# Patient Record
Sex: Male | Born: 1985 | Race: White | Hispanic: No | Marital: Single | Smoking: Current every day smoker
Health system: Southern US, Community
[De-identification: ages and names within clinical notes are randomized; demographics above are authoritative.]

## PROBLEM LIST (undated history)

## (undated) DIAGNOSIS — M25511 Pain in right shoulder: Secondary | ICD-10-CM

## (undated) DIAGNOSIS — S42009A Fracture of unspecified part of unspecified clavicle, initial encounter for closed fracture: Secondary | ICD-10-CM

## (undated) DIAGNOSIS — R933 Abnormal findings on diagnostic imaging of other parts of digestive tract: Secondary | ICD-10-CM

## (undated) DIAGNOSIS — S32059A Unspecified fracture of fifth lumbar vertebra, initial encounter for closed fracture: Secondary | ICD-10-CM

## (undated) DIAGNOSIS — T4145XA Adverse effect of unspecified anesthetic, initial encounter: Secondary | ICD-10-CM

## (undated) DIAGNOSIS — Q0700 Arnold-Chiari syndrome without spina bifida or hydrocephalus: Secondary | ICD-10-CM

## (undated) DIAGNOSIS — T8859XA Other complications of anesthesia, initial encounter: Secondary | ICD-10-CM

## (undated) HISTORY — DX: Unspecified fracture of fifth lumbar vertebra, initial encounter for closed fracture: S32.059A

## (undated) HISTORY — DX: Fracture of unspecified part of unspecified clavicle, initial encounter for closed fracture: S42.009A

## (undated) HISTORY — DX: Abnormal findings on diagnostic imaging of other parts of digestive tract: R93.3

---
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
## 2004-07-24 ENCOUNTER — Ambulatory Visit: Payer: Self-pay | Admitting: Internal Medicine

## 2005-03-23 ENCOUNTER — Ambulatory Visit: Payer: Self-pay | Admitting: Internal Medicine

## 2005-04-27 ENCOUNTER — Emergency Department (HOSPITAL_COMMUNITY): Admission: EM | Admit: 2005-04-27 | Discharge: 2005-04-27 | Payer: Self-pay | Admitting: Emergency Medicine

## 2005-09-25 ENCOUNTER — Ambulatory Visit: Payer: Self-pay | Admitting: Internal Medicine

## 2006-05-14 HISTORY — PX: OTHER SURGICAL HISTORY: SHX169

## 2006-09-24 ENCOUNTER — Inpatient Hospital Stay (HOSPITAL_COMMUNITY): Admission: RE | Admit: 2006-09-24 | Discharge: 2006-09-28 | Payer: Self-pay | Admitting: Neurosurgery

## 2007-03-20 ENCOUNTER — Encounter: Admission: RE | Admit: 2007-03-20 | Discharge: 2007-03-20 | Payer: Self-pay | Admitting: Neurosurgery

## 2007-07-27 ENCOUNTER — Emergency Department (HOSPITAL_COMMUNITY): Admission: EM | Admit: 2007-07-27 | Discharge: 2007-07-27 | Payer: Self-pay | Admitting: Emergency Medicine

## 2008-01-01 ENCOUNTER — Encounter: Admission: RE | Admit: 2008-01-01 | Discharge: 2008-01-01 | Payer: Self-pay | Admitting: Neurosurgery

## 2008-03-16 ENCOUNTER — Telehealth (INDEPENDENT_AMBULATORY_CARE_PROVIDER_SITE_OTHER): Payer: Self-pay | Admitting: *Deleted

## 2008-03-18 ENCOUNTER — Encounter: Payer: Self-pay | Admitting: Internal Medicine

## 2008-04-13 ENCOUNTER — Ambulatory Visit: Payer: Self-pay | Admitting: Internal Medicine

## 2008-04-13 DIAGNOSIS — Q054 Unspecified spina bifida with hydrocephalus: Secondary | ICD-10-CM | POA: Insufficient documentation

## 2008-04-13 DIAGNOSIS — IMO0002 Reserved for concepts with insufficient information to code with codable children: Secondary | ICD-10-CM | POA: Insufficient documentation

## 2008-04-13 DIAGNOSIS — F411 Generalized anxiety disorder: Secondary | ICD-10-CM | POA: Insufficient documentation

## 2008-04-13 LAB — CONVERTED CEMR LAB
Bilirubin Urine: NEGATIVE
Blood in Urine, dipstick: NEGATIVE
Glucose, Urine, Semiquant: NEGATIVE
WBC Urine, dipstick: NEGATIVE
pH: 6

## 2008-04-24 LAB — CONVERTED CEMR LAB
Basophils Relative: 3.8 % — ABNORMAL HIGH (ref 0.0–3.0)
CO2: 28 meq/L (ref 19–32)
Calcium: 9.5 mg/dL (ref 8.4–10.5)
Chloride: 105 meq/L (ref 96–112)
Creatinine, Ser: 0.9 mg/dL (ref 0.4–1.5)
Eosinophils Relative: 9.2 % — ABNORMAL HIGH (ref 0.0–5.0)
GFR calc Af Amer: 134 mL/min
GFR calc non Af Amer: 111 mL/min
HCT: 44.1 % (ref 39.0–52.0)
Lymphocytes Relative: 45.3 % (ref 12.0–46.0)
MCV: 86.4 fL (ref 78.0–100.0)
Monocytes Absolute: 0.7 10*3/uL (ref 0.1–1.0)
Monocytes Relative: 13.5 % — ABNORMAL HIGH (ref 3.0–12.0)
Neutro Abs: 1.4 10*3/uL (ref 1.4–7.7)
Potassium: 4.3 meq/L (ref 3.5–5.1)
Sodium: 142 meq/L (ref 135–145)
Total CHOL/HDL Ratio: 2.5
Total Protein: 6.9 g/dL (ref 6.0–8.3)
VLDL: 8 mg/dL (ref 0–40)

## 2008-04-27 ENCOUNTER — Telehealth (INDEPENDENT_AMBULATORY_CARE_PROVIDER_SITE_OTHER): Payer: Self-pay | Admitting: *Deleted

## 2008-04-27 ENCOUNTER — Encounter (INDEPENDENT_AMBULATORY_CARE_PROVIDER_SITE_OTHER): Payer: Self-pay | Admitting: *Deleted

## 2009-06-16 ENCOUNTER — Ambulatory Visit: Payer: Self-pay | Admitting: Internal Medicine

## 2009-06-16 DIAGNOSIS — K625 Hemorrhage of anus and rectum: Secondary | ICD-10-CM | POA: Insufficient documentation

## 2009-06-16 DIAGNOSIS — L989 Disorder of the skin and subcutaneous tissue, unspecified: Secondary | ICD-10-CM | POA: Insufficient documentation

## 2009-06-17 ENCOUNTER — Encounter (INDEPENDENT_AMBULATORY_CARE_PROVIDER_SITE_OTHER): Payer: Self-pay | Admitting: *Deleted

## 2009-06-20 LAB — CONVERTED CEMR LAB
Albumin: 4.3 g/dL (ref 3.5–5.2)
Basophils Absolute: 0.1 10*3/uL (ref 0.0–0.1)
Basophils Relative: 0.6 % (ref 0.0–3.0)
Bilirubin, Direct: 0 mg/dL (ref 0.0–0.3)
Creatinine, Ser: 0.9 mg/dL (ref 0.4–1.5)
Eosinophils Absolute: 0.4 10*3/uL (ref 0.0–0.7)
Eosinophils Relative: 4.5 % (ref 0.0–5.0)
HCT: 43 % (ref 39.0–52.0)
LDL Cholesterol: 57 mg/dL (ref 0–99)
Lymphocytes Relative: 25.6 % (ref 12.0–46.0)
MCHC: 33.8 g/dL (ref 30.0–36.0)
MCV: 88.4 fL (ref 78.0–100.0)
Neutro Abs: 5.1 10*3/uL (ref 1.4–7.7)
Platelets: 242 10*3/uL (ref 150.0–400.0)
RDW: 12 % (ref 11.5–14.6)
Total Bilirubin: 1.7 mg/dL — ABNORMAL HIGH (ref 0.3–1.2)
Total CHOL/HDL Ratio: 2
Total Protein: 7 g/dL (ref 6.0–8.3)
Triglycerides: 100 mg/dL (ref 0.0–149.0)
VLDL: 20 mg/dL (ref 0.0–40.0)
WBC: 8.4 10*3/uL (ref 4.5–10.5)

## 2009-06-21 ENCOUNTER — Ambulatory Visit: Payer: Self-pay | Admitting: Gastroenterology

## 2009-06-23 ENCOUNTER — Encounter: Payer: Self-pay | Admitting: Internal Medicine

## 2009-06-23 ENCOUNTER — Encounter (INDEPENDENT_AMBULATORY_CARE_PROVIDER_SITE_OTHER): Payer: Self-pay | Admitting: *Deleted

## 2009-07-07 ENCOUNTER — Ambulatory Visit: Payer: Self-pay | Admitting: Family Medicine

## 2009-07-11 ENCOUNTER — Telehealth (INDEPENDENT_AMBULATORY_CARE_PROVIDER_SITE_OTHER): Payer: Self-pay | Admitting: *Deleted

## 2009-07-11 ENCOUNTER — Encounter (INDEPENDENT_AMBULATORY_CARE_PROVIDER_SITE_OTHER): Payer: Self-pay | Admitting: *Deleted

## 2009-07-13 ENCOUNTER — Telehealth (INDEPENDENT_AMBULATORY_CARE_PROVIDER_SITE_OTHER): Payer: Self-pay | Admitting: *Deleted

## 2009-07-13 ENCOUNTER — Ambulatory Visit: Payer: Self-pay | Admitting: Family Medicine

## 2009-07-15 ENCOUNTER — Encounter (INDEPENDENT_AMBULATORY_CARE_PROVIDER_SITE_OTHER): Payer: Self-pay | Admitting: *Deleted

## 2009-07-15 ENCOUNTER — Ambulatory Visit: Payer: Self-pay | Admitting: Gastroenterology

## 2009-08-02 ENCOUNTER — Ambulatory Visit: Payer: Self-pay | Admitting: Gastroenterology

## 2009-08-05 ENCOUNTER — Encounter: Payer: Self-pay | Admitting: Gastroenterology

## 2010-02-22 ENCOUNTER — Ambulatory Visit: Payer: Self-pay | Admitting: Internal Medicine

## 2010-02-22 DIAGNOSIS — R42 Dizziness and giddiness: Secondary | ICD-10-CM

## 2010-06-13 NOTE — Letter (Signed)
Summary: Patient Notice- Polyp Results  [facility]  [address] [place], [place] [address]   Phone: [phone]  Fax: [phone]        August 05, 2009 MRN: [id number]    Jonathan Reese [address] [place], [place]  [address]    Dear [name],  I am pleased to inform you that the colon polyp(s) removed during your recent colonoscopy was (were) found to be benign (no cancer detected) upon pathologic examination.  I recommend you have a repeat colonoscopy examination in _ years to look for recurrent polyps, as having colon polyps increases your risk for having recurrent polyps or even colon cancer in the future.THE POLYP WAS A JUVENILE POLYP...FOLLOWUP ONLY AS NEEDED....  Should you develop new or worsening symptoms of abdominal pain, bowel habit changes or bleeding from the rectum or bowels, please schedule an evaluation with either your primary care physician or with me.  Additional information/recommendations:  _XX_ No further action with gastroenterology is needed at this time. Please      follow-up with your primary care physician for your other healthcare      needs.  __ Please call [phone] to schedule a return visit to review your      situation.  __ Please keep your follow-up visit as already scheduled.  __ Continue treatment plan as outlined the day of your exam.  Please call us if you are having persistent problems or have questions about your condition that have not been fully answered at this time.  Sincerely,  Mardella Layman MD [facility]  This letter has been electronically signed by your physician.  Appended Document: Patient Notice- Polyp Results letter mailed 3.28.11

## 2010-06-13 NOTE — Letter (Signed)
Summary: Generic Letter  [facility]  [address] [place], [place] [address]   Phone: [phone]  Fax: [phone]    07/11/2009  Jonathan Reese [address] [place], [place]  [address]  To Whom it May Concern:  Please allow the above patient to return back to work on July 11, 2009.          Sincerely,    Loreen Freud, DO

## 2010-06-13 NOTE — Letter (Signed)
Summary: New Patient letter  [facility]  [address] [place], [place] [address]   Phone: [phone]  Fax: [phone]       06/23/2009 MRN: [id number]  [facility] [address] [place], [place]  [address]  Dear Mr. STUDLEY,  Welcome to the Gastroenterology Division at [facility].    You are scheduled to see Dr. Jarold Motto on 07-15-09 at 10:00a.m. on the 3rd floor at [facility], [address].  We ask that you try to arrive at our office 15 minutes prior to your appointment time to allow for check-in.  We would like you to complete the enclosed self-administered evaluation form prior to your visit and bring it with you on the day of your appointment.  We will review it with you.  Also, please bring a complete list of all your medications or, if you prefer, bring the medication bottles and we will list them.  Please bring your insurance card so that we may make a copy of it.  If your insurance requires a referral to see a specialist, please bring your referral form from your primary care physician.  Co-payments are due at the time of your visit and may be paid by cash, check or credit card.     Your office visit will consist of a consult with your physician (includes a physical exam), any laboratory testing he/she may order, scheduling of any necessary diagnostic testing (e.g. x-ray, ultrasound, CT-scan), and scheduling of a procedure (e.g. Endoscopy, Colonoscopy) if required.  Please allow enough time on your schedule to allow for any/all of these possibilities.    If you cannot keep your appointment, please call [phone] to cancel or reschedule prior to your appointment date.  This allows [place] the opportunity to schedule an appointment for another patient in need of care.  If you do not cancel or reschedule by 5 p.m. the business day prior to your appointment date, you will be charged a $50.00 late cancellation/no-show fee.    Thank you for  choosing [facility] for your medical needs.  We appreciate the opportunity to care for you.  Please visit [place] at our website  to learn more about our practice.                     Sincerely,                                                             The Gastroenterology Division

## 2010-06-13 NOTE — Letter (Signed)
Summary: Out of Work  [facility]  [address] [place], [place] [address]   Phone: [phone]  Fax: [phone]    July 07, 2009   Employee:  EMERIL STILLE    To Whom It May Concern:   For Medical reasons, please excuse the above named employee from work for the following dates:  Start:   July 07, 2009  End:   July 08, 2009  If you need additional information, please feel free to contact our office.         Sincerely,    Loreen Freud DO

## 2010-06-13 NOTE — Assessment & Plan Note (Signed)
Summary: rectal bleeding--ch.   History of Present Illness Visit Type: Initial Consult Primary GI MD: Sheryn Bison MD FACP FAGA Primary Provider: Willow Ora MD Requesting Provider: Willow Ora MD Chief Complaint: rectal bleeding  History of Present Illness:   25 year old white male with one year of recurrent asymptomatic bright red blood per rectum without rectal pain. He does have occasional spasmodic left lower quadrant pain but denies abdominal gas, bloating, upper GI, hepatobiliary, or systemic complaints. He has not had previous barium studies or endoscopic exams. He is asymptomatic in terms of medical problems except for some nonspecific neurological problems. 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 I reviewed his chart today and recent screening for sexual transmitted diseases was negative and CBC and liver profile was normal except for slightly elevated bilirubin level. He has had multiple tattoos but denies any history of hepatitis or pancreatitis.He denies alcohol, or NSAID abuse but does smoke a pack of cigarettes a day.   GI Review of Systems    Reports abdominal pain and  nausea.     Location of  Abdominal pain: LLQ.    Denies acid reflux, belching, bloating, chest pain, dysphagia with liquids, dysphagia with solids, heartburn, loss of appetite, vomiting, vomiting blood, weight loss, and  weight gain.      Reports rectal bleeding.     Denies anal fissure, black tarry stools, change in bowel habit, constipation, diarrhea, diverticulosis, fecal incontinence, heme positive stool, hemorrhoids, irritable bowel syndrome, jaundice, light color stool, liver problems, and  rectal pain.    Current Medications (verified): 1)  Augmentin 875-125 Mg Tabs (Amoxicillin-Pot Clavulanate) .Marland Kitchen.. 1 By Mouth Two Times A Day  Allergies (verified): 1)  ! Jonetta Osgood  Past History:  Past Medical History: [name] Chiari Malformation Fractured back vertebrae/bulging  disk Fractured Right collarbone  Past Surgical History: Reviewed history from 04/13/2008 and no changes required. Neurosurgery for [employer] [facility] [employer] Chiari Malformation 5/08 , Dr Jeral Fruit  Family History: Reviewed history from 06/16/2009 and no changes required. Father: LIVING Mother: LIVING Siblings: 2 SISTERS, 1 BROTHER DM-MG UNCLE, MGM   HTN-- several family members  colon ca--no, uncle w/  anal ca prostate ca--no MI--no anxiety-- M, S   Social History: Reviewed history from 06/16/2009 and no changes required. Occupation: Engineer, petroleum Alcohol use--yes, socially Regular exercise--yes, regulalrly  diet--  follows a low salt diet, avoids junk food   Review of Systems  The patient denies allergy/sinus, anemia, anxiety-new, arthritis/joint pain, back pain, blood in urine, breast changes/lumps, change in vision, confusion, cough, coughing up blood, depression-new, fainting, fatigue, fever, headaches-new, hearing problems, heart murmur, heart rhythm changes, itching, menstrual pain, muscle pains/cramps, night sweats, nosebleeds, pregnancy symptoms, shortness of breath, skin rash, sleeping problems, sore throat, swelling of feet/legs, swollen lymph glands, thirst - excessive , urination - excessive , urination changes/pain, urine leakage, vision changes, and voice change.   General:  Denies fever, chills, sweats, anorexia, fatigue, weakness, malaise, weight loss, and sleep disorder. CV:  Denies chest pains, angina, palpitations, syncope, dyspnea on exertion, orthopnea, PND, peripheral edema, and claudication. Resp:  Denies dyspnea at rest, dyspnea with exercise, cough, sputum, wheezing, coughing up blood, and pleurisy. GI:  Denies difficulty swallowing, pain on swallowing, nausea, indigestion/heartburn, vomiting, vomiting blood, abdominal pain, jaundice, gas/bloating, diarrhea, constipation, change in bowel habits, bloody BM's, black BMs, and fecal incontinence. GU:  Denies urinary  burning, blood in urine, urinary frequency, urinary hesitancy, nocturnal urination, urinary incontinence, penile discharge, genital sores, decreased libido, and erectile dysfunction.  MS:  Denies joint pain / LOM, joint swelling, joint stiffness, joint deformity, low back pain, muscle weakness, muscle cramps, muscle atrophy, leg pain at night, leg pain with exertion, and shoulder pain / LOM hand / wrist pain (CTS). Derm:  Denies rash, itching, dry skin, hives, moles, warts, and unhealing ulcers. Neuro:  Complains of abnormal sensation and headache; denies weakness, paralysis, seizures, syncope, tremors, vertigo, transient blindness, frequent falls, frequent headaches, difficulty walking, sciatica, radiculopathy other:, restless legs, memory loss, and confusion. Psych:  Denies depression, anxiety, memory loss, suicidal ideation, hallucinations, paranoia, phobia, and confusion. Endo:  Denies cold intolerance, heat intolerance, polydipsia, polyphagia, polyuria, unusual weight change, and hirsutism. Heme:  Denies bruising, bleeding, enlarged lymph nodes, and pagophagia. Allergy:  Denies hives, rash, sneezing, hay fever, and recurrent infections.  Vital Signs:  Patient profile:   25 year old male Height:      66.25 inches Weight:      162 pounds BMI:     26.04 Pulse rate:   68 / minute Pulse rhythm:   regular BP sitting:   90 / 42  (left arm)  Vitals Entered By: Chales Abrahams CMA Duncan Dull) (July 15, 2009 9:54 AM)  Physical Exam  General:  Well developed, well nourished, no acute distress.healthy appearing.  Multiple tattoos noted with extensive tattooing on the back area. Head:  Normocephalic and atraumatic. Eyes:  PERRLA, no icterus.exam deferred to patient's ophthalmologist.   Neck:  Supple; no masses or thyromegaly. Lungs:  Clear throughout to auscultation. Heart:  Regular rate and rhythm; no murmurs, rubs,  or bruits. Abdomen:  Soft, nontender and nondistended. No masses, hepatosplenomegaly or  hernias noted. Normal bowel sounds.Tenderness is noted over a mobile sigmoid colon. Rectal:  Normal exam.inspection is unremarkable without hemorrhoids,fissures, or fistulae. No rectal masses or tenderness with normal color stool which is +1 guaiac positive. Prostate:  .normal size prostate.   Msk:  Symmetrical with no gross deformities. Normal posture. Pulses:  Normal pulses noted. Extremities:  No clubbing, cyanosis, edema or deformities noted. Neurologic:  Alert and  oriented x4;  grossly normal neurologically. Skin:  Intact without significant lesions or rashes. Cervical Nodes:  No significant cervical adenopathy. Axillary Nodes:  No significant axillary adenopathy. Inguinal Nodes:  No significant inguinal adenopathy. Psych:  Alert and cooperative. Normal mood and affect.   Impression & Recommendations:  Problem # 1:  RECTAL BLEEDING (ICD-569.3) Assessment Unchanged Rule Out adenomatous polyp versus juvenile polyposis. There is nothing in his history suggest underlying inflammatory bowel disease. He is not abusing NSAIDs or salicylates. He does have an uncle who has had rectal-anal carcinoma. 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 I scheduled him for colonoscopy exam and we'll proceed accordingly.  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 He was to check with Dr.Paz before proceeding further along these lines.  Problem # 3:  ANXIETY DISORDER (ICD-300.00) Assessment: Unchanged I cannot see where he is on anti-anxiety medication at this time. To me, he denies psychiatric difficulties.  Patient Instructions: 1)  Copy sent to : [facility].MD 2)  Please continue current medications.  3)  Colonoscopy and Flexible Sigmoidoscopy brochure given.  4)  Conscious Sedation brochure given.   Appended Document: rectal bleeding--ch.    Clinical Lists  Changes  Medications: Added new medication of MOVIPREP 100 GM  SOLR (PEG-KCL-NACL-NASULF-NA ASC-C) As per prep instructions. - Signed Rx of MOVIPREP 100 GM  SOLR (PEG-KCL-NACL-NASULF-NA ASC-C) As per prep instructions.;  #1 x 0;  Signed;  Entered by: Ashok Cordia RN;  Authorized by: Mardella Layman MD [facility];  Method used: Electronically to [facility] [address] [place], [place], [place]  [address], Ph: [phone], Fax: [phone] Orders: Added new Test order of Colonoscopy (Colon) - Signed    Prescriptions: MOVIPREP 100 GM  SOLR (PEG-KCL-NACL-NASULF-NA ASC-C) As per prep instructions.  #1 x 0   Entered by:   Ashok Cordia RN   Authorized by:   Mardella Layman MD [facility]   Signed by:   Ashok Cordia RN on 07/15/2009   Method used:   Electronically to        [facility] (retail)       [address]       [place], [place]  [address]       Ph: [phone]       Fax: [phone]   RxID:   [id number]

## 2010-06-13 NOTE — Assessment & Plan Note (Signed)
Summary: FOR DIZZINESS//PH   Vital Signs:  Patient profile:   25 year old male Weight:      158.25 pounds Pulse rate:   90 / minute Pulse rhythm:   regular BP sitting:   126 / 70  (left arm) Cuff size:   re   Vitals Entered By: Army Fossa CMA (February 22, 2010 2:50 PM) CC: Pt here c/o dizziness, HA's, nausea, tingling sensation in Head, neck, and face.  Comments going on for 3 months, gotten worse in the past month. Pharm- [facility]   History of Present Illness: here complaining of dizziness and headaches Symptoms started in 2008 after he had surgery for  [facility] Chiari Malformation ,  Dr Jeral Fruit the symptoms are episodic but he has noted that in the last 2 months they have increased in frequency and intensity He describes pain that goes from the right side of the neck to the right side of the head ("tingling, numbness"), sometimes nausea. The dizziness is sensation of "keep moving" usually worse when he gets his head down and then up or when he stands up from being seated . He spoke w/  Dr. Jeral Fruit office yesterday, they agreed that he will see me today  and take it from there. Dr. Jeral Fruit suggested that MRI of the brain with and without.  ROS Denies fevers No sinus infection type of symptoms No seizure disorder   Current Medications (verified): 1)  None  Allergies (verified): 1)  ! Jonetta Osgood  Past History:  Past Medical History: [name] Chiari Malformation Fractured back vertebrae/bulging disk Fractured Right collarbone 3-11 Cscope-----Pedunculated polyp in the sigmoid colon, Bx juvenile polyp  Past Surgical History: Neurosurgery for [facility] Chiari Malformation 5/08 , Dr Jeral Fruit  Social History: Reviewed history from 06/16/2009 and no changes required. Occupation: Engineer, petroleum Alcohol use--yes, socially Regular exercise--yes, regulalrly  diet--  follows a low salt diet, avoids junk food   Physical Exam  General:  alert and well-developed.     Eyes:  pupils equal and reactive Ears:  R ear normal and L ear normal.   Lungs:  normal respiratory effort, no intercostal retractions, and no accessory muscle use.   Heart:  normal rate, regular rhythm, and no murmur.   Neurologic:  alert & oriented X3, cranial nerves II-XII intact, strength normal in all extremities, and gait normal.   DTRs normal and symmetric except for a slightly stronger DTR in the left knee. Neck is full range of motion Psych:  not anxious appearing and not depressed appearing.     Impression & Recommendations:  Problem # 1:  DIZZINESS (ICD-780.4) ongoing dizziness since surgery for  [name] Chiari Malformation 5/08 plan: Will discuss with Dr. Cassandria Santee office to confirm  that he needs MRI of the brain with and without Will schedule an office visit with Dr. Jeral Fruit  ADDENDUM: per Dr Eino Farber assistant-- they already  ordered a MRI and will set up an appointment for him

## 2010-06-13 NOTE — Letter (Signed)
Summary: New Patient letter  [facility]  [address] [place], [place] [address]   Phone: [phone]  Fax: [phone]       06/17/2009 MRN: [id number]  [facility] [address] [place], [place]  [address]  Dear Jonathan Reese,  Welcome to the Gastroenterology Division at [facility].    You are scheduled to see Dr.  Jarold Motto on 06/21/2009 at 10:00AM on the 3rd floor at [facility], [address].  We ask that you try to arrive at our office 15 minutes prior to your appointment time to allow for check-in.  We would like you to complete the enclosed self-administered evaluation form prior to your visit and bring it with you on the day of your appointment.  We will review it with you.  Also, please bring a complete list of all your medications or, if you prefer, bring the medication bottles and we will list them.  Please bring your insurance card so that we may make a copy of it.  If your insurance requires a referral to see a specialist, please bring your referral form from your primary care physician.  Co-payments are due at the time of your visit and may be paid by cash, check or credit card.     Your office visit will consist of a consult with your physician (includes a physical exam), any laboratory testing he/she may order, scheduling of any necessary diagnostic testing (e.g. x-ray, ultrasound, CT-scan), and scheduling of a procedure (e.g. Endoscopy, Colonoscopy) if required.  Please allow enough time on your schedule to allow for any/all of these possibilities.    If you cannot keep your appointment, please call [phone] to cancel or reschedule prior to your appointment date.  This allows [place] the opportunity to schedule an appointment for another patient in need of care.  If you do not cancel or reschedule by 5 p.m. the business day prior to your appointment date, you will be charged a $50.00 late cancellation/no-show fee.    Thank you for  choosing [facility] for your medical needs.  We appreciate the opportunity to care for you.  Please visit [place] at our website  to learn more about our practice.                     Sincerely,                                                             The Gastroenterology Division

## 2010-06-13 NOTE — Procedures (Signed)
Summary: Colonoscopy  Patient: Jonathan Reese Note: All result statuses are Final unless otherwise noted.  Tests: (1) Colonoscopy (COL)   COL Colonoscopy           DONE     [facility]     [address]     [place], [place]  [address]           COLONOSCOPY PROCEDURE REPORT           PATIENT:  Jonathan Reese, Jonathan Reese  MR#:  [id number]     BIRTHDATE:  Dec 29, 1985, 23 yrs. old  GENDER:  male           ENDOSCOPIST:  Vania Rea. Jarold Motto, MD, [facility]     Referred by:           PROCEDURE DATE:  08/02/2009     PROCEDURE:  Colonoscopy with snare polypectomy     ASA CLASS:  Class I     INDICATIONS:  FOBT positive stool, rectal bleeding           MEDICATIONS:   Fentanyl 75 mcg IV, Versed 7 mg IV           DESCRIPTION OF PROCEDURE:   After the risks benefits and     alternatives of the procedure were thoroughly explained, informed     consent was obtained.  Digital rectal exam was performed and     revealed no abnormalities.   The LB CF-[id number] endoscope     was introduced through the anus and advanced to the cecum, which     was identified by both the appendix and ileocecal valve, without     limitations.  The quality of the prep was good, using MoviPrep.     The instrument was then slowly withdrawn as the colon was fully     examined.     <<PROCEDUREIMAGES>>           FINDINGS:  A pedunculated polyp was found in the sigmoid colon.     LARGE 3CM. VASCULAR ON A THICK STALK HOT SNARE EXCISED AND SENT TO     PATH FOR EXAM.  This was otherwise a normal examination of the     colon.   Retroflexed views in the rectum revealed no     abnormalities.    The scope was then withdrawn from the patient     and the procedure completed.           COMPLICATIONS:  None           ENDOSCOPIC IMPRESSION:     1) Pedunculated polyp in the sigmoid colon     2) Otherwise normal examination     ADENOMA VS JUVENILE POLYP.     RECOMMENDATIONS:     1) Await biopsy results           REPEAT EXAM:  No          ______________________________     Vania Rea. Jarold Motto, MD, [facility] Graham           CC:  Willow Ora, MD           n.     Rosalie Doctor:   Vania Rea. Izaia Say at 08/02/2009 03:29 PM           Clance Boll, [id number]  Note: An exclamation mark (!) indicates a result that was not dispersed into the flowsheet. Document Creation Date: 08/02/2009 3:29 PM _______________________________________________________________________  (1) Order result status: Final Collection or observation date-time: 08/02/2009 15:23 Requested date-time:  Receipt date-time:  Reported date-time:  Referring Physician:   Ordering Physician: Sheryn Bison [id number]) Specimen Source:  Source: Launa Grill Order Number: [id number] Lab site:

## 2010-06-13 NOTE — Miscellaneous (Signed)
Summary: Waiver of Liability/[employer]  Waiver of Liability/[facility] [place]   Imported By: Lanelle Bal 06/21/2009 13:57:51  _____________________________________________________________________  External Attachment:    Type:   Image     Comment:   External Document

## 2010-06-13 NOTE — Assessment & Plan Note (Signed)
Summary: CPX AND FASTING LABS//SP[facility]   Vital Signs:  Patient profile:   25 year old male Height:      66.25 inches Weight:      158.2 pounds BMI:     25.43 Pulse rate:   68 / minute BP sitting:   112 / 80  Vitals Entered By: Dena Billet CC: cpx   History of Present Illness: CPX and multiple other issues :  blood in stools x 8 months , mixed w/ stools or floating in the water  denies nausea, vomiting, weight loss.  No pain no pain. no history of hemorrhoids  R shoulder and back pain x 3-4 years -- saw ortho for his back, the MRI showed Arnold Chiari Malformation.   "balance is off" symptoms started few months after neuro surgery  [name] Chiari Malformation)  2009  penile lesions, moles, noted about a year ago, getting larger  Allergies: No Known Drug Allergies  Past History:  Past Medical History: Arnold Chiari Malformation  Past Surgical History: Reviewed history from 04/13/2008 and no changes required. Neurosurgery for [facility] Chiari Malformation 5/08 , Dr Jeral Fruit  Family History: Reviewed history from 04/13/2008 and no changes required. Father: LIVING Mother: LIVING Siblings: 2 SISTERS, 1 BROTHER DM-MG UNCLE, MGM   HTN-- several family members  colon ca--no, uncle w/  anal ca prostate ca--no MI--no anxiety-- M, S   Social History: Occupation: Games developer Single Alcohol use--yes, socially Regular exercise--yes, regulalrly  diet--  follows a low salt diet, avoids junk food  Does Patient Exercise:  yes Smoking Status:  current Packs/Day:  1/2  Review of Systems General:  Denies fatigue, fever, and weight loss. CV:  Denies chest pain or discomfort and swelling of feet. Resp:  Denies cough and shortness of breath. Psych:  Denies depression; occasionally anxious .  Physical Exam  General:  alert, well-developed, and well-nourished.   Lungs:  normal respiratory effort, no intercostal retractions, no accessory muscle use, and normal breath  sounds.   Heart:  normal rate, regular rhythm, no murmur, and no gallop.   Abdomen:  soft, non-tender, no distention, no masses, no guarding, and no rigidity.   Rectal:  No external abnormalities noted. Normal sphincter tone. No rectal masses or tenderness. anoscopy: Few small internal hemorrhoids Genitalia:  circumcised, no hydrocele, no varicocele, no scrotal masses, no testicular masses or atrophy, and no urethral discharge.  at the base of the penis, he has several slightly papular dark lesions Prostate:  Prostate gland firm and smooth, no enlargement, nodularity, tenderness, mass, asymmetry or induration. Extremities:  no edema   Impression & Recommendations:  Problem # 1:  ROUTINE GENERAL MEDICAL EXAM@HEALTH  CARE FACL (ICD-V70.0)  Td 2005 per patient  flu shot--declined continue andhealthy lifestyle labs counseled: printed material provided regards STE, safe sex, safe driving  f/u 3 months to check on his other  issues   Problem # 2:  other problems : rectal bleeding : These could be from internal hemorrhoids however the patient states that sometimes the blood  is "mixed with stools". I recommend a GI evaluation, further workup? Chronic dizziness-- re asses on RTC shoulder and low back pain-- re asses on RTC penile lesions: Dermatology referral  Other Orders: Venipuncture (62130) TLB-Hepatic/Liver Function Pnl (80076-HEPATIC) TLB-BMP (Basic Metabolic Panel-BMET) (80048-METABOL) TLB-CBC Platelet - w/Differential (85025-CBCD) TLB-Lipid Panel (80061-LIPID) T-HIV Antibody  (Reflex) ([id number]) T-RPR (Syphilis) (95284-13244) Gastroenterology Referral (GI) Dermatology Referral (Derma)  Patient Instructions: 1)  Please schedule a follow-up appointment in 3 months .  Past Medical History:    Reviewed history from 04/13/2008 and no changes required:       [name] Chiari Malformation  Past Surgical History:    Reviewed history from 04/13/2008 and no changes required:        Neurosurgery for [facility] Chiari Malformation 5/08 , Dr Jeral Fruit

## 2010-06-13 NOTE — Progress Notes (Signed)
Summary: FMLA FORM TO COMPLETE  Phone Note Call from Patient Call back at Home Phone [phone]   Caller: Patient Summary of Call: PATIENT DROPPED OFF FMLA FORM FOR DR Laury Axon TO COMPLETE  PLEASE CALL HIM AT [phone] TO PICK UP  PUT IN PLASTIC SLEEVE AND TOOK TO [employer] Initial call taken by: Jerolyn Shin,  July 13, 2009 9:16 AM  Follow-up for Phone Call        PT STATES FORMS ARE FOR [employer] WHEN HE WAS TAKEN OUT OF WORK ON THE FOLLOWIING DATES 07-07-09 TO 07-08-09..................Marland KitchenFelecia Deloach CMA  July 13, 2009 9:32 AM   Additional Follow-up for Phone Call Additional follow up Details #1::        done Additional Follow-up by: Loreen Freud DO,  July 13, 2009 11:06 AM    Additional Follow-up for Phone Call Additional follow up Details #2::    PT AWARE FORMS PLACED UP FRONT FOR PICK-UP.......................Marland KitchenFelecia Deloach CMA  July 13, 2009 11:21 AM

## 2010-06-13 NOTE — Progress Notes (Signed)
Summary: phone  Phone Note Call from Patient   Caller: Patient Summary of Call: Patient states he has to have one stating he can come to work tonight. Feb 28,2011. Please advise Patient is waiting Initial call taken by: Barb Merino,  July 11, 2009 1:17 PM  Follow-up for Phone Call        Note given to patient. Army Fossa CMA  July 11, 2009 1:25 PM

## 2010-06-13 NOTE — Letter (Signed)
Summary: [facility] Instructions  [facility]  [address] [place], [place] [address]   Phone: [phone]  Fax: [phone]       Jonathan Reese    1985-12-02    MRN: [id number]        Procedure Day /Date: Tuesday,  08/02/09     Arrival Time: 1:30      Procedure Time: 2:30     Location of Procedure:                    [facility] (4th Floor) _                       PREPARATION FOR COLONOSCOPY WITH MOVIPREP   Starting 5 days prior to your procedure 07/28/09 do not eat nuts, seeds, popcorn, corn, beans, peas,  salads, or any raw vegetables.  Do not take any fiber supplements (e.g. Metamucil, Citrucel, and Benefiber).  THE DAY BEFORE YOUR PROCEDURE         DATE: 08/01/09   DAY: Monday  1.  Drink clear liquids the entire day-NO SOLID FOOD  2.  Do not drink anything colored red or purple.  Avoid juices with pulp.  No orange juice.  3.  Drink at least 64 oz. (8 glasses) of fluid/clear liquids during the day to prevent dehydration and help the prep work efficiently.  CLEAR LIQUIDS INCLUDE: Water Jello Ice Popsicles Tea (sugar ok, no milk/cream) Powdered fruit flavored drinks Coffee (sugar ok, no milk/cream) Gatorade Juice: apple, white grape, white cranberry  Lemonade Clear bullion, consomm, broth Carbonated beverages (any kind) Strained chicken noodle soup Hard Candy                             4.  In the morning, mix first dose of MoviPrep solution:    Empty 1 Pouch A and 1 Pouch B into the disposable container    Add lukewarm drinking water to the top line of the container. Mix to dissolve    Refrigerate (mixed solution should be used within 24 hrs)  5.  Begin drinking the prep at 5:00 p.m. The MoviPrep container is divided by 4 marks.   Every 15 minutes drink the solution down to the next mark (approximately 8 oz) until the full liter is complete.   6.  Follow completed prep with 16 oz of clear liquid of your choice (Nothing  red or purple).  Continue to drink clear liquids until bedtime.  7.  Before going to bed, mix second dose of MoviPrep solution:    Empty 1 Pouch A and 1 Pouch B into the disposable container    Add lukewarm drinking water to the top line of the container. Mix to dissolve    Refrigerate  THE DAY OF YOUR PROCEDURE      DATE: 08/02/09  DAY: Tuesday  Beginning at 9:30 a.m. (5 hours before procedure):         1. Every 15 minutes, drink the solution down to the next mark (approx 8 oz) until the full liter is complete.  2. Follow completed prep with 16 oz. of clear liquid of your choice.    3. You may drink clear liquids until 12:30 (2 HOURS BEFORE PROCEDURE).   MEDICATION INSTRUCTIONS  Unless otherwise instructed, you should take regular prescription medications with a small sip of water   as early as possible the morning  of your procedure.                   OTHER INSTRUCTIONS  You will need a responsible adult at least 26 years of age to accompany you and drive you home.   This person must remain in the waiting room during your procedure.  Wear loose fitting clothing that is easily removed.  Leave jewelry and other valuables at home.  However, you may wish to bring a book to read or  an iPod/MP3 player to listen to music as you wait for your procedure to start.  Remove all body piercing jewelry and leave at home.  Total time from sign-in until discharge is approximately 2-3 hours.  You should go home directly after your procedure and rest.  You can resume normal activities the  day after your procedure.  The day of your procedure you should not:   Drive   Make legal decisions   Operate machinery   Drink alcohol   Return to work  You will receive specific instructions about eating, activities and medications before you leave.    The above instructions have been reviewed and explained to me by   _______________________    I fully understand and can  verbalize these instructions _____________________________ Date _________

## 2010-06-13 NOTE — Assessment & Plan Note (Signed)
Summary: LEFT EYE PAIN/RH......   Vital Signs:  Patient profile:   25 year old male Weight:      161 pounds Temp:     99.3 degrees F oral Pulse rate:   72 / minute Pulse rhythm:   regular BP sitting:   122 / 80  (left arm) Cuff size:   regular  Vitals Entered By: Army Fossa CMA (July 07, 2009 9:24 AM) CC: Pt c/o eyes matted shut this a.m., having body aches. , URI symptoms   History of Present Illness:       This is a 25 year old man who presents with URI symptoms.  The symptoms began 3 weeks ago.  Pt c/o eyes being matted shut and yellow d/c for 1 day.  The patient complains of nasal congestion, purulent nasal discharge, productive cough, and sick contacts.  The patient denies fever, low-grade fever (<100.5 degrees), fever of 100.5-103 degrees, fever of 103.1-104 degrees, fever to >104 degrees, stiff neck, dyspnea, wheezing, rash, vomiting, diarrhea, use of an antipyretic, and response to antipyretic.  The patient also reports itchy watery eyes, headache, and muscle aches.  The patient denies itchy throat, sneezing, seasonal symptoms, response to antihistamine, and severe fatigue.  The patient denies the following risk factors for Strep sinusitis: unilateral facial pain, unilateral nasal discharge, poor response to decongestant, double sickening, tooth pain, Strep exposure, tender adenopathy, and absence of cough.    Allergies (verified): No Known Drug Allergies  Physical Exam  General:  Well-developed,well-nourished,in no acute distress; alert,appropriate and cooperative throughout examination Eyes:  + green d/c and sclera injected Ears:  External ear exam shows no significant lesions or deformities.  Otoscopic examination reveals clear canals, tympanic membranes are intact bilaterally without bulging, retraction, inflammation or discharge. Hearing is grossly normal bilaterally. Nose:  L frontal sinus tenderness, L maxillary sinus tenderness, R frontal sinus tenderness, and R  maxillary sinus tenderness.   Mouth:  Oral mucosa and oropharynx without lesions or exudates.  Teeth in good repair. Neck:  No deformities, masses, or tenderness noted. Lungs:  Normal respiratory effort, chest expands symmetrically. Lungs are clear to auscultation, no crackles or wheezes. Heart:  Normal rate and regular rhythm. S1 and S2 normal without gallop, murmur, click, rub or other extra sounds. Psych:  Oriented X3 and normally interactive.     Impression & Recommendations:  Problem # 1:  OTHER ACUTE SINUSITIS (ICD-461.8)  His updated medication list for this problem includes:    Augmentin 875-125 Mg Tabs (Amoxicillin-pot clavulanate) .Marland Kitchen... 1 by mouth two times a day    Veramyst 27.5 Mcg/spray Susp (Fluticasone furoate) .Marland Kitchen... 2 sprays each  Instructed on treatment. Call if symptoms persist or worsen.   Problem # 2:  CONJUNCTIVITIS, BACTERIAL (ICD-372.03)  His updated medication list for this problem includes:    Vigamox 0.5 % Soln (Moxifloxacin hcl) .Marland Kitchen... 1 gtt three times a day  Discussed treatment, and urged patient to wash hands carefully after touching face.   Complete Medication List: 1)  Augmentin 875-125 Mg Tabs (Amoxicillin-pot clavulanate) .Marland Kitchen.. 1 by mouth two times a day 2)  Vigamox 0.5 % Soln (Moxifloxacin hcl) .Marland Kitchen.. 1 gtt three times a day 3)  Veramyst 27.5 Mcg/spray Susp (Fluticasone furoate) .... 2 sprays each Prescriptions: VIGAMOX 0.5 % SOLN (MOXIFLOXACIN HCL) 1 gtt three times a day  #7 days x 0   Entered and Authorized by:   Loreen Freud DO   Signed by:   Loreen Freud DO on 07/07/2009   Method used:  Electronically to        [facility] [address]* (retail)       [address]       [place], [place]  [address]       Ph: [phone]       Fax: [phone]   RxID:   [id number] AUGMENTIN 875-125 MG TABS (AMOXICILLIN-POT CLAVULANATE) 1 by mouth two times a day  #20 x 0   Entered and Authorized by:   Loreen Freud DO   Signed by:   Loreen Freud DO on 07/07/2009   Method used:   Electronically to        [facility] [id number]* (retail)       [address]       [place], [place]  [address]       Ph: [phone]       Fax: [phone]   RxID:   [id number]

## 2010-06-13 NOTE — Miscellaneous (Signed)
Summary: Immunization Entry   Immunization History:  Hepatitis B Immunization History:    Hepatitis B # 1:  historical (02/13/1996)    Hepatitis B # 2:  historical (03/19/1996)    Hepatitis B # 3:  historical (08/19/1996)  DPT Immunization History:    DPT # 1:  historical (01/23/1985)    DPT # 2:  historical (04/03/1985)    DPT # 3:  historical (10/16/1985)    DPT # 4:  historical (05/25/1986)    DPT # 5:  historical (12/30/1989)  HIB Immunization History:    HIB # 1:  historical (03/16/1998)  Polio Immunization History:    Polio # 1:  historical (01/23/1985)    Polio # 2:  historical (04/03/1985)    Polio # 3:  historical (10/16/1985)    Polio # 4:  historical (05/25/1986)    Polio # 5:  historical (12/30/1989)  MMR Immunization History:    MMR # 1:  historical (05/25/1986)    MMR # 2:  historical (12/30/1989)  Tetanus/Td Immunization History:    Tetanus/Td:  historical (01/01/1996)    Tetanus/Td:  td (01/19/2004)  Varicella Immunization History:    History of chickenpox:  yes (04/13/1985)

## 2010-06-13 NOTE — Miscellaneous (Signed)
Summary: Vaccine Record  Vaccine Record   Imported By: Lanelle Bal 06/28/2009 10:55:01  _____________________________________________________________________  External Attachment:    Type:   Image     Comment:   External Document
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
## 2010-09-26 NOTE — Op Note (Signed)
Jonathan Reese, Jonathan Reese               ACCOUNT NO.:  [id number]   MEDICAL RECORD NO.:  [id number]          PATIENT TYPE:  INP   LOCATION:  3172                         FACILITY:  [facility]   PHYSICIAN:  Hilda Lias, M.D.   DATE OF BIRTH:  Aug 30, 1985   DATE OF PROCEDURE:  09/24/2006  DATE OF DISCHARGE:                               OPERATIVE REPORT   PREOPERATIVE DIAGNOSIS:  Arnold-Chiari malformation.   POSTOPERATIVE DIAGNOSIS:  Arnold-Chiari malformation.   PROCEDURE:  Bicipital craniectomy, removal of the arch of C1,  decompression with the posterior fossa on the upper cervical,  microscope.   SURGEON:  Hilda Lias, M.D.   CLINICAL HISTORY:  [name] is a gentleman who was in my office  because of syrinx after he was intubated in OR.  He also has some lower  back pain.  The last repeated MRI showed that he has Arnold-Chiari with  the tonsils of the cerebellum going below C1.  Surgery was advised and  the risks were explained to him and his family.  The mother is a Scientist, water quality with Dr. Samuel Bouche.   PROCEDURE IN DETAIL:  The patient was taken to the OR, and after  intubation, three pins were applied to the head and he as positioned and  appropriately monitored.  The same reflection was done and then the head  was shaved.  Drapes were applied.  The skin was cleaned with DuraPrep.  Then a midline incision from the inion down to C2 was made and muscle  was retracted laterally.  Then with the drill, we did a decompression  with removal of the occipital bone about 1 x 1 inch.  Then because we  knew and the possibility of going below the C1, we proceeded with the  Kerrison punch as well as the drill to remove the posterior arch of C1.  We preserved the facet.  Then we brought the microscope.  A Y-type of  incision was made with a lower base.  There was quite a bit of adhesion  between the dura mater which was seen in the arachnoid.  There was some  CSF.  We were able to visualize  the tonsils and they were quite below  C1, close to C2 but not into C2.  Having good decompression, we put in a  new pouch and for that we used Duraguard.  The four edges were secured  in place with 4-0 Nurolon and the edge of the Duraguard was attached to  the dura mater using 4-0 Prolene in running stitches.  Valsalva maneuver  was negative.  The left Tisseel was left in the area to cover the  possibility of any CSF leak.  The area was irrigated and the muscle was  closed with Vicryl and Steri-Strip.           ______________________________  Hilda Lias, M.D.     EB/MEDQ  D:  09/24/2006  T:  09/24/2006  Job:  [id number]   cc:   Coletta Memos, M.D.  Claude Manges. Cleophas Dunker, M.D.

## 2010-09-29 NOTE — Discharge Summary (Signed)
NAMEWILBERN, Jonathan Reese               ACCOUNT NO.:  [id number]   MEDICAL RECORD NO.:  [id number]          PATIENT TYPE:  INP   LOCATION:  3008                         FACILITY:  [facility]   PHYSICIAN:  Hilda Lias, M.D.   DATE OF BIRTH:  Sep 25, 1985   DATE OF ADMISSION:  09/24/2006  DATE OF DISCHARGE:  09/28/2006                               DISCHARGE SUMMARY   ADMISSION DIAGNOSIS:  Arnold-Chiarimalformation with cervical syrinx.   FINAL DIAGNOSIS:  Arnold-Chiari malformation with cervical syrinx.   BRIEF HISTORY:  The patient was admitted because of neck pain with  radiation to the arms.  MRI showed that he has Arnold-Chiari with a  syrinx in the cervical area.  Surgery was advised.   LABORATORY:  Normal.   COURSE IN THE HOSPITAL:  The patient has a 3-4 cervical compression with  removal of the arch of C1.  After surgery, the patient did well,  complained of  normal postoperative pain.  On May 17, he was seen by Dr.  Phoebe Perch who discharged him.   CONDITION ON DISCHARGE:  Improving.   MEDICATIONS:  Percocet, diazepam, and also ferritin.   DIET:  Regular.   ACTIVITY:  Not to drive.   FOLLOWUP:  He is supposed to be seen by me in 2 weeks post discharge.           ______________________________  Hilda Lias, M.D.     EB/MEDQ  D:  10/22/2006  T:  10/22/2006  Job:  [id number]

## 2011-07-20 ENCOUNTER — Emergency Department (INDEPENDENT_AMBULATORY_CARE_PROVIDER_SITE_OTHER): Payer: Worker's Compensation

## 2011-07-20 ENCOUNTER — Encounter (HOSPITAL_BASED_OUTPATIENT_CLINIC_OR_DEPARTMENT_OTHER): Payer: Self-pay | Admitting: *Deleted

## 2011-07-20 ENCOUNTER — Emergency Department (HOSPITAL_BASED_OUTPATIENT_CLINIC_OR_DEPARTMENT_OTHER)
Admission: EM | Admit: 2011-07-20 | Discharge: 2011-07-21 | Disposition: A | Discharge status: Home / Self Care | Payer: Worker's Compensation | Attending: Emergency Medicine | Admitting: Emergency Medicine

## 2011-07-20 DIAGNOSIS — M549 Dorsalgia, unspecified: Secondary | ICD-10-CM

## 2011-07-20 DIAGNOSIS — R51 Headache: Secondary | ICD-10-CM

## 2011-07-20 DIAGNOSIS — S161XXA Strain of muscle, fascia and tendon at neck level, initial encounter: Secondary | ICD-10-CM

## 2011-07-20 DIAGNOSIS — M542 Cervicalgia: Secondary | ICD-10-CM

## 2011-07-20 DIAGNOSIS — S339XXA Sprain of unspecified parts of lumbar spine and pelvis, initial encounter: Secondary | ICD-10-CM | POA: Insufficient documentation

## 2011-07-20 DIAGNOSIS — M4120 Other idiopathic scoliosis, site unspecified: Secondary | ICD-10-CM

## 2011-07-20 DIAGNOSIS — Y92410 Unspecified street and highway as the place of occurrence of the external cause: Secondary | ICD-10-CM | POA: Insufficient documentation

## 2011-07-20 DIAGNOSIS — M545 Low back pain: Secondary | ICD-10-CM

## 2011-07-20 DIAGNOSIS — S139XXA Sprain of joints and ligaments of unspecified parts of neck, initial encounter: Secondary | ICD-10-CM | POA: Insufficient documentation

## 2011-07-20 DIAGNOSIS — M25519 Pain in unspecified shoulder: Secondary | ICD-10-CM

## 2011-07-20 DIAGNOSIS — S39012A Strain of muscle, fascia and tendon of lower back, initial encounter: Secondary | ICD-10-CM

## 2011-07-20 MED ORDER — OXYCODONE-ACETAMINOPHEN 5-325 MG PO TABS
1.0000 | ORAL_TABLET | Freq: Once | ORAL | Status: AC
  Administered 2011-07-20: 1 via ORAL
  Filled 2011-07-20: qty 1

## 2011-07-20 MED ORDER — NAPROXEN 500 MG PO TABS: 500.0000 mg | ORAL_TABLET | Freq: Two times a day (BID) | ORAL | Status: DC

## 2011-07-20 MED ORDER — OXYCODONE-ACETAMINOPHEN 5-325 MG PO TABS: 1.0000 | ORAL_TABLET | ORAL | Status: DC | PRN

## 2011-07-20 NOTE — ED Notes (Signed)
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  Pt also has fx vertebra (L5) and two bulging disks.
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
## 2011-07-24 ENCOUNTER — Encounter: Payer: Self-pay | Admitting: Internal Medicine

## 2011-07-24 ENCOUNTER — Encounter: Payer: Self-pay | Admitting: *Deleted

## 2011-07-24 ENCOUNTER — Ambulatory Visit (INDEPENDENT_AMBULATORY_CARE_PROVIDER_SITE_OTHER): Payer: Worker's Compensation | Admitting: Internal Medicine

## 2011-07-24 VITALS — BP 118/68 | HR 92 | Temp 98.8°F | Ht 66.75 in | Wt 154.0 lb

## 2011-07-24 DIAGNOSIS — S060XAA Concussion with loss of consciousness status unknown, initial encounter: Secondary | ICD-10-CM

## 2011-07-24 DIAGNOSIS — S239XXA Sprain of unspecified parts of thorax, initial encounter: Secondary | ICD-10-CM

## 2011-07-24 DIAGNOSIS — S060X9A Concussion with loss of consciousness of unspecified duration, initial encounter: Secondary | ICD-10-CM

## 2011-07-24 DIAGNOSIS — S139XXA Sprain of joints and ligaments of unspecified parts of neck, initial encounter: Secondary | ICD-10-CM

## 2011-07-24 MED ORDER — OXYCODONE-ACETAMINOPHEN 5-325 MG PO TABS: 1.0000 | ORAL_TABLET | ORAL | Status: AC | PRN

## 2011-07-24 MED ORDER — CYCLOBENZAPRINE HCL 10 MG PO TABS: 10.0000 mg | ORAL_TABLET | Freq: Two times a day (BID) | ORAL | Status: AC | PRN

## 2011-07-24 NOTE — Patient Instructions (Signed)
  Head Injury, Adult You have had a head injury that does not appear serious at this time. A concussion is a state of changed mental ability, usually from a blow to the head. You should take clear liquids for the rest of the day and then resume your regular diet. You should not take sedatives or alcoholic beverages for as long as directed by your caregiver after discharge. After injuries such as yours, most problems occur within the first 24 hours. SYMPTOMS These minor symptoms may be experienced after discharge:  Memory difficulties.   Dizziness.   Headaches.   Double vision.   Hearing difficulties.   Depression.   Tiredness.   Weakness.   Difficulty with concentration.  If you experience any of these problems, you should not be alarmed. A concussion requires a few days for recovery. Many patients with head injuries frequently experience such symptoms. Usually, these problems disappear without medical care. If symptoms last for more than one day, notify your caregiver. See your caregiver sooner if symptoms are becoming worse rather than better. HOME CARE INSTRUCTIONS   During the next 24 hours you must stay with someone who can watch you for the warning signs listed below.  Although it is unlikely that serious side effects will occur, you should be aware of signs and symptoms which may necessitate your return to this location. Side effects may occur up to 7 - 10 days following the injury. It is important for you to carefully monitor your condition and contact your caregiver or seek immediate medical attention if there is a change in your condition. SEEK IMMEDIATE MEDICAL CARE IF:   There is confusion or drowsiness.   You can not awaken the injured person.   There is nausea (feeling sick to your stomach) or continued, forceful vomiting.   You notice dizziness or unsteadiness which is getting worse, or inability to walk.   You have convulsions or unconsciousness.   You  experience severe, persistent headaches not relieved by over-the-counter or prescription medicines for pain. (Do not take aspirin as this impairs clotting abilities). Take other pain medications only as directed.   You can not use arms or legs normally.   There is clear or bloody discharge from the nose or ears.  MAKE SURE YOU:   Understand these instructions.   Will watch your condition.   Will get help right away if you are not doing well or get worse.  Document Released: 04/30/2005 Document Revised: 04/19/2011 Document Reviewed: 03/18/2009 [facility] Patient Information 2012 [place], [place].  Continue oxycodone, naproxen. Also add flexeril , a muscle relaxant. Oxycodone-flexeril will make you drowsy, be careful Call any time if the nausea increases, you start vomiting Also if nausea no better in 7-10 days
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
## 2011-08-20 ENCOUNTER — Ambulatory Visit (INDEPENDENT_AMBULATORY_CARE_PROVIDER_SITE_OTHER): Payer: No Typology Code available for payment source | Admitting: Internal Medicine

## 2011-08-20 VITALS — BP 126/80 | HR 121 | Temp 97.8°F | Wt 154.0 lb

## 2011-08-20 DIAGNOSIS — M542 Cervicalgia: Secondary | ICD-10-CM

## 2011-08-20 DIAGNOSIS — R519 Headache, unspecified: Secondary | ICD-10-CM | POA: Insufficient documentation

## 2011-08-20 DIAGNOSIS — F411 Generalized anxiety disorder: Secondary | ICD-10-CM

## 2011-08-20 DIAGNOSIS — R51 Headache: Secondary | ICD-10-CM
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
## 2011-08-20 NOTE — Patient Instructions (Signed)
We'll call you with the appointment for a CT of the head. Please call if your symptoms increase.
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
## 2011-08-21 ENCOUNTER — Encounter: Payer: Self-pay | Admitting: Internal Medicine

## 2011-08-23 ENCOUNTER — Ambulatory Visit (INDEPENDENT_AMBULATORY_CARE_PROVIDER_SITE_OTHER)
Admission: RE | Admit: 2011-08-23 | Discharge: 2011-08-23 | Disposition: A | Discharge status: Home / Self Care | Payer: 59 | Source: Ambulatory Visit | Attending: Internal Medicine | Admitting: Internal Medicine

## 2011-08-23 DIAGNOSIS — R51 Headache: Secondary | ICD-10-CM

## 2012-07-29 ENCOUNTER — Other Ambulatory Visit: Payer: Self-pay | Admitting: Orthopedic Surgery

## 2012-07-31 ENCOUNTER — Encounter (HOSPITAL_COMMUNITY): Payer: Self-pay | Admitting: Pharmacy Technician

## 2012-08-07 ENCOUNTER — Encounter (HOSPITAL_COMMUNITY): Payer: Self-pay

## 2012-08-07 ENCOUNTER — Encounter (HOSPITAL_COMMUNITY)
Admission: RE | Admit: 2012-08-07 | Discharge: 2012-08-07 | Disposition: A | Discharge status: Home / Self Care | Payer: Worker's Compensation | Source: Ambulatory Visit | Attending: Orthopedic Surgery | Admitting: Orthopedic Surgery

## 2012-08-07 HISTORY — DX: Arnold-Chiari syndrome without spina bifida or hydrocephalus: Q07.00

## 2012-08-07 HISTORY — DX: Pain in right shoulder: M25.511

## 2012-08-07 HISTORY — DX: Other complications of anesthesia, initial encounter: T88.59XA

## 2012-08-07 HISTORY — DX: Adverse effect of unspecified anesthetic, initial encounter: T41.45XA

## 2012-08-07 LAB — URINALYSIS, ROUTINE W REFLEX MICROSCOPIC
Bilirubin Urine: NEGATIVE
Hgb urine dipstick: NEGATIVE
Ketones, ur: NEGATIVE mg/dL
Nitrite: NEGATIVE
Urobilinogen, UA: 0.2 mg/dL (ref 0.0–1.0)

## 2012-08-07 LAB — SURGICAL PCR SCREEN
MRSA, PCR: NEGATIVE
Staphylococcus aureus: POSITIVE — AB

## 2012-08-07 LAB — CBC WITH DIFFERENTIAL/PLATELET
Basophils Relative: 1 % (ref 0–1)
Eosinophils Absolute: 0.7 10*3/uL (ref 0.0–0.7)
Eosinophils Relative: 10 % — ABNORMAL HIGH (ref 0–5)
Hemoglobin: 15 g/dL (ref 13.0–17.0)
MCH: 29.7 pg (ref 26.0–34.0)
MCHC: 36.3 g/dL — ABNORMAL HIGH (ref 30.0–36.0)
Monocytes Absolute: 0.5 10*3/uL (ref 0.1–1.0)
Monocytes Relative: 7 % (ref 3–12)
Neutrophils Relative %: 57 % (ref 43–77)

## 2012-08-07 LAB — PROTIME-INR
INR: 0.97 (ref 0.00–1.49)
Prothrombin Time: 12.8 seconds (ref 11.6–15.2)

## 2012-08-07 LAB — TYPE AND SCREEN
ABO/RH(D): B POS
Antibody Screen: NEGATIVE

## 2012-08-07 LAB — COMPREHENSIVE METABOLIC PANEL
Albumin: 4.2 g/dL (ref 3.5–5.2)
BUN: 8 mg/dL (ref 6–23)
Calcium: 9.9 mg/dL (ref 8.4–10.5)
Creatinine, Ser: 0.99 mg/dL (ref 0.50–1.35)
Potassium: 4.1 mEq/L (ref 3.5–5.1)
Total Protein: 7.1 g/dL (ref 6.0–8.3)

## 2012-08-07 NOTE — Pre-Procedure Instructions (Signed)
Jonathan Reese  08/07/2012   Your procedure is scheduled on:  April 3  Report to [facility] at Call at 8 am to determine time of arrival  Call this number if you have problems the morning of surgery: [phone]   Remember:   Do not eat food or drink liquids after midnight.   Take these medicines the morning of surgery with A SIP OF WATER: Hydrocodone   Do not wear jewelry, make-up or nail polish.  Do not wear lotions, powders, or perfumes. You may wear deodorant.  Do not shave 48 hours prior to surgery. Men may shave face and neck.  Do not bring valuables to the hospital.  Contacts, dentures or bridgework may not be worn into surgery.  Leave suitcase in the car. After surgery it may be brought to your room.  For patients admitted to the hospital, checkout time is 11:00 AM the day of discharge.   Special Instructions: Shower using CHG 2 nights before surgery and the night before surgery.  If you shower the day of surgery use CHG.  Use special wash - you have one bottle of CHG for all showers.  You should use approximately 1/3 of the bottle for each shower.   Please read over the following fact sheets that you were given: Pain Booklet, Coughing and Deep Breathing, Blood Transfusion Information and Surgical Site Infection Prevention

## 2012-08-12 HISTORY — PX: SPINAL FUSION: SHX223

## 2012-08-13 MED ORDER — VANCOMYCIN HCL IN DEXTROSE 1-5 GM/200ML-% IV SOLN
1000.0000 mg | INTRAVENOUS | Status: AC
  Administered 2012-08-14: 1000 mg via INTRAVENOUS
  Filled 2012-08-13: qty 200

## 2012-08-13 NOTE — Progress Notes (Signed)
Pt notified of arrival time-1130 on 08/14/12

## 2012-08-14 ENCOUNTER — Encounter (HOSPITAL_COMMUNITY): Payer: Self-pay | Admitting: Certified Registered"

## 2012-08-14 ENCOUNTER — Encounter (HOSPITAL_COMMUNITY): Payer: Self-pay | Admitting: Anesthesiology

## 2012-08-14 ENCOUNTER — Ambulatory Visit (HOSPITAL_COMMUNITY): Payer: Worker's Compensation | Admitting: Anesthesiology

## 2012-08-14 ENCOUNTER — Encounter (HOSPITAL_COMMUNITY)
Admission: RE | Disposition: A | Discharge status: Home / Self Care | Payer: Self-pay | Source: Ambulatory Visit | Attending: Orthopedic Surgery

## 2012-08-14 ENCOUNTER — Ambulatory Visit (HOSPITAL_COMMUNITY): Payer: Worker's Compensation

## 2012-08-14 ENCOUNTER — Inpatient Hospital Stay (HOSPITAL_COMMUNITY)
Admission: RE | Admit: 2012-08-14 | Discharge: 2012-08-16 | DRG: 460 | Disposition: A | Discharge status: Home / Self Care | Payer: Worker's Compensation | Source: Ambulatory Visit | Attending: Orthopedic Surgery | Admitting: Orthopedic Surgery

## 2012-08-14 DIAGNOSIS — Z01812 Encounter for preprocedural laboratory examination: Secondary | ICD-10-CM

## 2012-08-14 DIAGNOSIS — Z01810 Encounter for preprocedural cardiovascular examination: Secondary | ICD-10-CM

## 2012-08-14 DIAGNOSIS — M51379 Other intervertebral disc degeneration, lumbosacral region without mention of lumbar back pain or lower extremity pain: Principal | ICD-10-CM | POA: Diagnosis present

## 2012-08-14 DIAGNOSIS — Z01818 Encounter for other preprocedural examination: Secondary | ICD-10-CM

## 2012-08-14 DIAGNOSIS — F17200 Nicotine dependence, unspecified, uncomplicated: Secondary | ICD-10-CM | POA: Diagnosis present

## 2012-08-14 DIAGNOSIS — M5137 Other intervertebral disc degeneration, lumbosacral region: Principal | ICD-10-CM | POA: Diagnosis present

## 2012-08-14 SURGERY — POSTERIOR LUMBAR FUSION 1 LEVEL
Anesthesia: General | Site: Back | Laterality: Left | Wound class: Clean

## 2012-08-14 MED ORDER — POLYETHYLENE GLYCOL 3350 17 G PO PACK: 17.0000 g | PACK | Freq: Every day | ORAL | Status: DC | PRN

## 2012-08-14 MED ORDER — THROMBIN 20000 UNITS EX SOLR
CUTANEOUS | Status: AC
  Filled 2012-08-14: qty 20000

## 2012-08-14 MED ORDER — SODIUM CHLORIDE 0.9 % IV SOLN
INTRAVENOUS | Status: DC
  Administered 2012-08-14: 19:00:00 via INTRAVENOUS

## 2012-08-14 MED ORDER — VANCOMYCIN HCL IN DEXTROSE 1-5 GM/200ML-% IV SOLN
1000.0000 mg | Freq: Once | INTRAVENOUS | Status: AC
  Administered 2012-08-14: 1000 mg via INTRAVENOUS
  Filled 2012-08-14: qty 200

## 2012-08-14 MED ORDER — MORPHINE SULFATE (PF) 1 MG/ML IV SOLN
INTRAVENOUS | Status: DC
  Administered 2012-08-14 (×2): via INTRAVENOUS
  Administered 2012-08-14: 21 mg via INTRAVENOUS
  Administered 2012-08-15 (×2): via INTRAVENOUS
  Administered 2012-08-15: 25.22 mg via INTRAVENOUS
  Administered 2012-08-15: 21 mg via INTRAVENOUS
  Filled 2012-08-14 (×3): qty 25

## 2012-08-14 MED ORDER — BUPIVACAINE-EPINEPHRINE 0.25% -1:200000 IJ SOLN
INTRAMUSCULAR | Status: AC
  Filled 2012-08-14: qty 1

## 2012-08-14 MED ORDER — FLEET ENEMA 7-19 GM/118ML RE ENEM: 1.0000 | ENEMA | Freq: Once | RECTAL | Status: AC | PRN

## 2012-08-14 MED ORDER — OXYCODONE HCL 5 MG/5ML PO SOLN: 5.0000 mg | Freq: Once | ORAL | Status: DC | PRN

## 2012-08-14 MED ORDER — DIAZEPAM 5 MG PO TABS
5.0000 mg | ORAL_TABLET | Freq: Four times a day (QID) | ORAL | Status: DC | PRN
  Administered 2012-08-15 – 2012-08-16 (×6): 5 mg via ORAL
  Filled 2012-08-14 (×6): qty 1

## 2012-08-14 MED ORDER — MENTHOL 3 MG MT LOZG
1.0000 | LOZENGE | OROMUCOSAL | Status: DC | PRN
  Filled 2012-08-14: qty 9

## 2012-08-14 MED ORDER — DIPHENHYDRAMINE HCL 50 MG/ML IJ SOLN: 12.5000 mg | Freq: Four times a day (QID) | INTRAMUSCULAR | Status: DC | PRN

## 2012-08-14 MED ORDER — FENTANYL CITRATE 0.05 MG/ML IJ SOLN
INTRAMUSCULAR | Status: DC | PRN
  Administered 2012-08-14 (×5): 50 ug via INTRAVENOUS
  Administered 2012-08-14: 100 ug via INTRAVENOUS

## 2012-08-14 MED ORDER — BUPIVACAINE-EPINEPHRINE 0.25% -1:200000 IJ SOLN
INTRAMUSCULAR | Status: DC | PRN
  Administered 2012-08-14: 20 mL
  Administered 2012-08-14: 5 mL

## 2012-08-14 MED ORDER — MINERAL OIL LIGHT 100 % EX OIL
TOPICAL_OIL | CUTANEOUS | Status: AC
  Filled 2012-08-14: qty 25

## 2012-08-14 MED ORDER — BISACODYL 10 MG RE SUPP: 10.0000 mg | Freq: Every day | RECTAL | Status: DC | PRN

## 2012-08-14 MED ORDER — ROCURONIUM BROMIDE 100 MG/10ML IV SOLN
INTRAVENOUS | Status: DC | PRN
  Administered 2012-08-14: 10 mg via INTRAVENOUS
  Administered 2012-08-14: 40 mg via INTRAVENOUS
  Administered 2012-08-14: 30 mg via INTRAVENOUS

## 2012-08-14 MED ORDER — MORPHINE SULFATE 2 MG/ML IJ SOLN: 1.0000 mg | INTRAMUSCULAR | Status: DC | PRN

## 2012-08-14 MED ORDER — HYDROMORPHONE HCL PF 1 MG/ML IJ SOLN
INTRAMUSCULAR | Status: AC
  Administered 2012-08-14: 0.5 mg via INTRAVENOUS
  Filled 2012-08-14: qty 1

## 2012-08-14 MED ORDER — ONDANSETRON HCL 4 MG/2ML IJ SOLN
INTRAMUSCULAR | Status: DC | PRN
  Administered 2012-08-14: 4 mg via INTRAVENOUS

## 2012-08-14 MED ORDER — ONDANSETRON HCL 4 MG/2ML IJ SOLN: 4.0000 mg | Freq: Four times a day (QID) | INTRAMUSCULAR | Status: DC | PRN

## 2012-08-14 MED ORDER — PROMETHAZINE HCL 25 MG/ML IJ SOLN: 6.2500 mg | INTRAMUSCULAR | Status: DC | PRN

## 2012-08-14 MED ORDER — HYDROMORPHONE HCL PF 1 MG/ML IJ SOLN
0.2500 mg | INTRAMUSCULAR | Status: DC | PRN
  Administered 2012-08-14 (×2): 0.5 mg via INTRAVENOUS

## 2012-08-14 MED ORDER — MIDAZOLAM HCL 2 MG/2ML IJ SOLN: 0.5000 mg | Freq: Once | INTRAMUSCULAR | Status: DC | PRN

## 2012-08-14 MED ORDER — DIPHENHYDRAMINE HCL 12.5 MG/5ML PO ELIX: 12.5000 mg | ORAL_SOLUTION | Freq: Four times a day (QID) | ORAL | Status: DC | PRN

## 2012-08-14 MED ORDER — CEFAZOLIN SODIUM 1-5 GM-% IV SOLN: 1.0000 g | Freq: Three times a day (TID) | INTRAVENOUS | Status: DC

## 2012-08-14 MED ORDER — PHENOL 1.4 % MT LIQD: 1.0000 | OROMUCOSAL | Status: DC | PRN

## 2012-08-14 MED ORDER — NALOXONE HCL 0.4 MG/ML IJ SOLN: 0.4000 mg | INTRAMUSCULAR | Status: DC | PRN

## 2012-08-14 MED ORDER — MORPHINE SULFATE (PF) 1 MG/ML IV SOLN
INTRAVENOUS | Status: AC
  Filled 2012-08-14: qty 25

## 2012-08-14 MED ORDER — ACETAMINOPHEN 650 MG RE SUPP: 650.0000 mg | RECTAL | Status: DC | PRN

## 2012-08-14 MED ORDER — LACTATED RINGERS IV SOLN
INTRAVENOUS | Status: DC | PRN
  Administered 2012-08-14 (×4): via INTRAVENOUS

## 2012-08-14 MED ORDER — ALUM & MAG HYDROXIDE-SIMETH 200-200-20 MG/5ML PO SUSP: 30.0000 mL | Freq: Four times a day (QID) | ORAL | Status: DC | PRN

## 2012-08-14 MED ORDER — HYDROCODONE-ACETAMINOPHEN 5-325 MG PO TABS
1.0000 | ORAL_TABLET | ORAL | Status: DC | PRN
  Administered 2012-08-15 – 2012-08-16 (×5): 2 via ORAL
  Filled 2012-08-14 (×5): qty 2

## 2012-08-14 MED ORDER — SODIUM CHLORIDE 0.9 % IJ SOLN: 9.0000 mL | INTRAMUSCULAR | Status: DC | PRN

## 2012-08-14 MED ORDER — LIDOCAINE HCL (CARDIAC) 20 MG/ML IV SOLN
INTRAVENOUS | Status: DC | PRN
  Administered 2012-08-14: 20 mg via INTRAVENOUS

## 2012-08-14 MED ORDER — PROPOFOL 10 MG/ML IV BOLUS
INTRAVENOUS | Status: DC | PRN
  Administered 2012-08-14: 200 mg via INTRAVENOUS

## 2012-08-14 MED ORDER — OXYCODONE HCL 5 MG PO TABS: 5.0000 mg | ORAL_TABLET | Freq: Once | ORAL | Status: DC | PRN

## 2012-08-14 MED ORDER — THROMBIN 20000 UNITS EX SOLR
OROMUCOSAL | Status: DC | PRN
  Administered 2012-08-14: 13:00:00 via TOPICAL

## 2012-08-14 MED ORDER — ACETAMINOPHEN 325 MG PO TABS: 650.0000 mg | ORAL_TABLET | ORAL | Status: DC | PRN

## 2012-08-14 MED ORDER — INDIGOTINDISULFONATE SODIUM 8 MG/ML IJ SOLN
INTRAMUSCULAR | Status: AC
  Filled 2012-08-14: qty 5

## 2012-08-14 MED ORDER — ONDANSETRON HCL 4 MG/2ML IJ SOLN: 4.0000 mg | INTRAMUSCULAR | Status: DC | PRN

## 2012-08-14 MED ORDER — MEPERIDINE HCL 25 MG/ML IJ SOLN: 6.2500 mg | INTRAMUSCULAR | Status: DC | PRN

## 2012-08-14 MED ORDER — MIDAZOLAM HCL 5 MG/5ML IJ SOLN
INTRAMUSCULAR | Status: DC | PRN
  Administered 2012-08-14: 2 mg via INTRAVENOUS

## 2012-08-14 MED ORDER — HEMOSTATIC AGENTS (NO CHARGE) OPTIME
TOPICAL | Status: DC | PRN
  Administered 2012-08-14: 1 via TOPICAL

## 2012-08-14 MED ORDER — PROPOFOL INFUSION 10 MG/ML OPTIME
INTRAVENOUS | Status: DC | PRN
  Administered 2012-08-14: 100 ug/kg/min via INTRAVENOUS

## 2012-08-14 MED ORDER — ZOLPIDEM TARTRATE 5 MG PO TABS
5.0000 mg | ORAL_TABLET | Freq: Every evening | ORAL | Status: DC | PRN
  Administered 2012-08-15: 5 mg via ORAL
  Filled 2012-08-14: qty 1

## 2012-08-14 MED ORDER — OXYCODONE-ACETAMINOPHEN 5-325 MG PO TABS
1.0000 | ORAL_TABLET | ORAL | Status: DC | PRN
  Administered 2012-08-15 – 2012-08-16 (×6): 2 via ORAL
  Filled 2012-08-14 (×6): qty 2

## 2012-08-14 MED ORDER — LACTATED RINGERS IV SOLN
INTRAVENOUS | Status: DC
  Administered 2012-08-14: 11:00:00 via INTRAVENOUS

## 2012-08-14 MED ORDER — 0.9 % SODIUM CHLORIDE (POUR BTL) OPTIME
TOPICAL | Status: DC | PRN
  Administered 2012-08-14 (×2): 1000 mL

## 2012-08-14 SURGICAL SUPPLY — 79 items
APL SKNCLS STERI-STRIP NONHPOA (GAUZE/BANDAGES/DRESSINGS) ×1
BENZOIN TINCTURE PRP APPL 2/3 (GAUZE/BANDAGES/DRESSINGS) ×2 IMPLANT
BLADE SURG ROTATE 9660 (MISCELLANEOUS) ×1 IMPLANT
BUR ROUND PRECISION 4.0 (BURR) ×2 IMPLANT
CAGE CONCORDE BULLET 9X11X27 (Cage) ×2 IMPLANT
CAGE SPNL PRLL BLT NOSE 27X9 (Cage) IMPLANT
CARTRIDGE OIL MAESTRO DRILL (MISCELLANEOUS) ×1 IMPLANT
CLOTH BEACON ORANGE TIMEOUT ST (SAFETY) ×2 IMPLANT
CONT SPEC STER OR (MISCELLANEOUS) ×2 IMPLANT
CORDS BIPOLAR (ELECTRODE) ×2 IMPLANT
COVER SURGICAL LIGHT HANDLE (MISCELLANEOUS) ×2 IMPLANT
DIFFUSER DRILL AIR PNEUMATIC (MISCELLANEOUS) ×2 IMPLANT
DRAIN CHANNEL 15F RND FF W/TCR (WOUND CARE) IMPLANT
DRAPE C-ARM 42X72 X-RAY (DRAPES) ×2 IMPLANT
DRAPE ORTHO SPLIT 77X108 STRL (DRAPES)
DRAPE POUCH INSTRU U-SHP 10X18 (DRAPES) ×2 IMPLANT
DRAPE SURG 17X23 STRL (DRAPES) ×6 IMPLANT
DRAPE SURG ORHT 6 SPLT 77X108 (DRAPES) ×1 IMPLANT
DURAPREP 26ML APPLICATOR (WOUND CARE) ×2 IMPLANT
ELECT BLADE 4.0 EZ CLEAN MEGAD (MISCELLANEOUS) ×2
ELECT CAUTERY BLADE 6.4 (BLADE) ×2 IMPLANT
ELECT REM PT RETURN 9FT ADLT (ELECTROSURGICAL) ×2
ELECTRODE BLDE 4.0 EZ CLN MEGD (MISCELLANEOUS) ×1 IMPLANT
ELECTRODE REM PT RTRN 9FT ADLT (ELECTROSURGICAL) ×1 IMPLANT
EVACUATOR SILICONE 100CC (DRAIN) IMPLANT
GAUZE SPONGE 4X4 16PLY XRAY LF (GAUZE/BANDAGES/DRESSINGS) ×5 IMPLANT
GLOVE BIO SURGEON STRL SZ7 (GLOVE) ×2 IMPLANT
GLOVE BIO SURGEON STRL SZ8 (GLOVE) ×6 IMPLANT
GLOVE BIOGEL PI IND STRL 7.5 (GLOVE) ×1 IMPLANT
GLOVE BIOGEL PI IND STRL 8 (GLOVE) ×1 IMPLANT
GLOVE BIOGEL PI INDICATOR 7.5 (GLOVE) ×1
GLOVE BIOGEL PI INDICATOR 8 (GLOVE) ×1
GLOVE ECLIPSE 6.5 STRL STRAW (GLOVE) ×1 IMPLANT
GLOVE SURG SS PI 6.5 STRL IVOR (GLOVE) ×3 IMPLANT
GOWN STRL NON-REIN LRG LVL3 (GOWN DISPOSABLE) ×4 IMPLANT
GOWN STRL REIN XL XLG (GOWN DISPOSABLE) ×2 IMPLANT
INDIGO CARMINE 8MG/ML IMPLANT
IV CATH 14GX2 1/4 (CATHETERS) ×2 IMPLANT
KIT BASIN OR (CUSTOM PROCEDURE TRAY) ×2 IMPLANT
KIT POSITION SURG JACKSON T1 (MISCELLANEOUS) ×1 IMPLANT
KIT ROOM TURNOVER OR (KITS) ×2 IMPLANT
MARKER SKIN DUAL TIP RULER LAB (MISCELLANEOUS) ×2 IMPLANT
NDL HYPO 25GX1X1/2 BEV (NEEDLE) ×1 IMPLANT
NDL SPNL 18GX3.5 QUINCKE PK (NEEDLE) ×2 IMPLANT
NEEDLE 22X1 1/2 (OR ONLY) (NEEDLE) ×1 IMPLANT
NEEDLE BONE MARROW 8GX6 FENEST (NEEDLE) ×1 IMPLANT
NEEDLE HYPO 25GX1X1/2 BEV (NEEDLE) ×2 IMPLANT
NEEDLE SPNL 18GX3.5 QUINCKE PK (NEEDLE) ×4 IMPLANT
NS IRRIG 1000ML POUR BTL (IV SOLUTION) ×3 IMPLANT
OIL CARTRIDGE MAESTRO DRILL (MISCELLANEOUS) ×2
PACK LAMINECTOMY ORTHO (CUSTOM PROCEDURE TRAY) ×2 IMPLANT
PACK UNIVERSAL I (CUSTOM PROCEDURE TRAY) ×2 IMPLANT
PACK VITOSS BIOACTIVE 10CC (Neuro Prosthesis/Implant) ×1 IMPLANT
PAD ARMBOARD 7.5X6 YLW CONV (MISCELLANEOUS) ×4 IMPLANT
PATTIES SURGICAL .5 X1 (DISPOSABLE) ×2 IMPLANT
PATTIES SURGICAL .5X1.5 (GAUZE/BANDAGES/DRESSINGS) ×2 IMPLANT
ROD EXPEDIUM PREBENT 5.5X35MM (Rod) ×2 IMPLANT
SCREW POLYAXIAL 7X45MM (Screw) ×4 IMPLANT
SCREW SET SINGLE INNER (Screw) ×4 IMPLANT
SPONGE GAUZE 4X4 12PLY (GAUZE/BANDAGES/DRESSINGS) ×2 IMPLANT
SPONGE INTESTINAL PEANUT (DISPOSABLE) ×2 IMPLANT
SPONGE LAP 18X18 X RAY DECT (DISPOSABLE) ×1 IMPLANT
SPONGE SURGIFOAM ABS GEL 100 (HEMOSTASIS) ×2 IMPLANT
STRIP CLOSURE SKIN 1/2X4 (GAUZE/BANDAGES/DRESSINGS) ×3 IMPLANT
SURGIFLO TRUKIT (HEMOSTASIS) ×1 IMPLANT
SUT MNCRL AB 4-0 PS2 18 (SUTURE) ×4 IMPLANT
SUT VIC AB 0 CT1 18XCR BRD 8 (SUTURE) ×1 IMPLANT
SUT VIC AB 0 CT1 8-18 (SUTURE) ×2
SUT VIC AB 1 CT1 18XCR BRD 8 (SUTURE) ×2 IMPLANT
SUT VIC AB 1 CT1 8-18 (SUTURE) ×2
SUT VIC AB 2-0 CT2 18 VCP726D (SUTURE) ×2 IMPLANT
SYR 20CC LL (SYRINGE) ×2 IMPLANT
SYR BULB IRRIGATION 50ML (SYRINGE) ×2 IMPLANT
SYR CONTROL 10ML LL (SYRINGE) ×3 IMPLANT
TOWEL OR 17X24 6PK STRL BLUE (TOWEL DISPOSABLE) ×2 IMPLANT
TOWEL OR 17X26 10 PK STRL BLUE (TOWEL DISPOSABLE) ×3 IMPLANT
TRAY FOLEY CATH 14FR (SET/KITS/TRAYS/PACK) ×2 IMPLANT
WATER STERILE IRR 1000ML POUR (IV SOLUTION) ×1 IMPLANT
YANKAUER SUCT BULB TIP NO VENT (SUCTIONS) ×2 IMPLANT

## 2012-08-14 NOTE — Anesthesia Postprocedure Evaluation (Signed)
Anesthesia Post Note  Patient: Jonathan Reese  Procedure(s) Performed: Procedure(s) (LRB): POSTERIOR LUMBAR FUSION 1 LEVEL--Left sided lumbar 4-5 transforaminal lumbar interbody fusion with instrumentation, vitoss, bone marrow aspirate (Left)  Anesthesia type: General  Patient location: PACU  Post pain: Pain level controlled and Adequate analgesia  Post assessment: Post-op Vital signs reviewed, Patient's Cardiovascular Status Stable, Respiratory Function Stable, Patent Airway and Pain level controlled  Last Vitals:  Filed Vitals:   08/14/12 1800  BP: 116/60  Pulse: 74  Temp:   Resp: 16    Post vital signs: Reviewed and stable  Level of consciousness: awake, alert  and oriented  Complications: No apparent anesthesia complications

## 2012-08-14 NOTE — Anesthesia Preprocedure Evaluation (Signed)
Anesthesia Evaluation  Patient identified by MRN, date of birth, ID band Patient awake    Reviewed: Allergy & Precautions, H&P , NPO status , Patient's Chart, lab work & pertinent test results  History of Anesthesia Complications Negative for: history of anesthetic complications  Airway Mallampati: I TM Distance: >3 FB Neck ROM: Full    Dental  (+) Teeth Intact and Dental Advisory Given   Pulmonary Current Smoker,  breath sounds clear to auscultation  Pulmonary exam normal       Cardiovascular negative cardio ROS  Rhythm:Regular Rate:Normal     Neuro/Psych Anxiety S/p craniectomy for Arnold-Chiari Chronic back pain, narcotic dependent    GI/Hepatic negative GI ROS, Neg liver ROS,   Endo/Other  negative endocrine ROS  Renal/GU negative Renal ROS     Musculoskeletal   Abdominal   Peds  Hematology   Anesthesia Other Findings   Reproductive/Obstetrics                           Anesthesia Physical Anesthesia Plan  ASA: II  Anesthesia Plan: General   Post-op Pain Management:    Induction: Intravenous  Airway Management Planned: Oral ETT  Additional Equipment:   Intra-op Plan:   Post-operative Plan: Extubation in OR  Informed Consent: I have reviewed the patients History and Physical, chart, labs and discussed the procedure including the risks, benefits and alternatives for the proposed anesthesia with the patient or authorized representative who has indicated his/her understanding and acceptance.   Dental advisory given  Plan Discussed with: Surgeon and CRNA  Anesthesia Plan Comments: (Plan routine monitors, GETA)        Anesthesia Quick Evaluation

## 2012-08-14 NOTE — Progress Notes (Signed)
Just had telephone communication with patient. He sounds excellent. Having appropriate and expected LBP. Explained details of his surgery. Will d/c PCA in the AM and start PT. Likely will be able to d/c home tomorrow vs. Saturday.

## 2012-08-14 NOTE — Transfer of Care (Signed)
Immediate Anesthesia Transfer of Care Note  Patient: Jonathan Reese  Procedure(s) Performed: Procedure(s) with comments: POSTERIOR LUMBAR FUSION 1 LEVEL--Left sided lumbar 4-5 transforaminal lumbar interbody fusion with instrumentation, vitoss, bone marrow aspirate (Left) - Left sided lumbar 4-5 transforaminal lumbar interbody fusion with instrumentation, vitoss, bone marrow aspirate  Patient Location: PACU  Anesthesia Type:General  Level of Consciousness: awake, alert  and oriented  Airway & Oxygen Therapy: Patient Spontanous Breathing and Patient connected to nasal cannula oxygen  Post-op Assessment: Report given to PACU RN and Post -op Vital signs reviewed and stable  Post vital signs: Reviewed and stable  Complications: No apparent anesthesia complications

## 2012-08-14 NOTE — Preoperative (Signed)
Beta Blockers   Reason not to administer Beta Blockers:Not Applicable 
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
## 2012-08-15 MED ORDER — TAMSULOSIN HCL 0.4 MG PO CAPS
0.4000 mg | ORAL_CAPSULE | Freq: Every day | ORAL | Status: DC
  Filled 2012-08-15: qty 1

## 2012-08-15 MED ORDER — TAMSULOSIN HCL 0.4 MG PO CAPS: 0.4000 mg | ORAL_CAPSULE | Freq: Every evening | ORAL | Status: DC

## 2012-08-15 MED ORDER — TAMSULOSIN HCL 0.4 MG PO CAPS
0.4000 mg | ORAL_CAPSULE | Freq: Every day | ORAL | Status: DC
  Administered 2012-08-15: 0.4 mg via ORAL
  Filled 2012-08-15 (×2): qty 1

## 2012-08-15 MED FILL — Heparin Sodium (Porcine) Inj 1000 Unit/ML: INTRAMUSCULAR | Qty: 30 | Status: AC

## 2012-08-15 MED FILL — Sodium Chloride IV Soln 0.9%: INTRAVENOUS | Qty: 1000 | Status: AC

## 2012-08-15 MED FILL — Sodium Chloride Irrigation Soln 0.9%: Qty: 3000 | Status: AC

## 2012-08-15 NOTE — Progress Notes (Signed)
1 Day PO L4-5 TLIF for DDD/LBP. Doing well, pain and stiffness in LB as expected, otherwise without complaint. Pain well controlled on medications  BP 113/61  Pulse 95  Temp(Src) 99.5 F (37.5 C) (Oral)  Resp 20  SpO2 100% Dressing C/D/I, 2+ DPP, - Homans, 5/5 DFLX/PFLX =BIL, neurovascularly intact  1 Day PO L4-5 TLIF for DDD/LBP  D/C PCA, transition to percocet/valium   Written scripts in chart for D/C  Up with PT today  D/C today pending PT and Px Control, pt wishes to go home

## 2012-08-15 NOTE — Evaluation (Addendum)
Occupational Therapy Evaluation Patient Details Name: Jonathan Reese MRN: [id number] DOB: [birth year] Today's Date: 08/15/2012 Time: 7829-5621 OT Time Calculation (min): 33 min  OT Assessment / Plan / Recommendation  Clinical Impression    Jonathan Reese is a 27 y/o male s/p 1 level posterior lumbar fusion. Pt states he is feeling better with more mobility. Pt is at overall minguard level for ADLs and ambulation. Demonstrated AE with pt and family. Pt able to maintain back precautions during session. Pt will have assistance available from family upon returning home. No acute OT needs.      OT Assessment  Patient does not need any further OT services    Follow Up Recommendations  No OT follow up    Barriers to Discharge      Equipment Recommendations  3 in 1 bedside comode    Recommendations for Other Services    Frequency       Precautions / Restrictions Precautions Precautions: Back Precaution Booklet Issued: Yes (comment) Precaution Comments: Educated pt on back precautions .  Restrictions Weight Bearing Restrictions: No   Pertinent Vitals/Pain Pain 8/10. Repositioned, emotional support, and distraction.     ADL  Eating/Feeding: Independent Where Assessed - Eating/Feeding: Chair Grooming: Min guard Where Assessed - Grooming: Supported standing Upper Body Bathing: Supervision/safety Where Assessed - Upper Body Bathing: Supported sitting Lower Body Bathing: Supervision/safety Where Assessed - Lower Body Bathing: Supported sitting Upper Body Dressing: Supervision/safety Where Assessed - Upper Body Dressing: Supported sitting Lower Body Dressing: Supervision/safety Where Assessed - Lower Body Dressing: Supported sitting Toilet Transfer: Min Pension scheme manager Method: Sit to Barista: Raised toilet seat with arms (or 3-in-1 over toilet) Toileting - Clothing Manipulation and Hygiene: Min guard Where Assessed - Engineer, mining and  Hygiene: Standing Tub/Shower Transfer: Insurance risk surveyor Method: Science writer: Shower seat with back Equipment Used: Gait belt;Rolling walker (educated on AE ) ADL Comments: Pt at overall Minguard level. OT educated pt on AE.      OT Goals    Visit Information  Last OT Received On: 08/15/12 Assistance Needed: +1    Subjective Data      Prior Functioning     Home Living Lives With: Alone Available Help at Discharge: Family;Available 24 hours/day Type of Home: House Home Access: Level entry Home Layout: One level Bathroom Shower/Tub: Engineer, manufacturing systems: Standard Home Adaptive Equipment: Walker - rolling Prior Function Level of Independence: Independent Communication Communication: No difficulties         Vision/Perception     Cognition  Cognition Overall Cognitive Status: Appears within functional limits for tasks assessed/performed Arousal/Alertness: Awake/alert Orientation Level: Oriented X4 / Intact Behavior During Session: [facility] for tasks performed    Extremity/Trunk Assessment Right Upper Extremity Assessment RUE ROM/Strength/Tone: [facility] for tasks assessed Left Upper Extremity Assessment LUE ROM/Strength/Tone: WFL for tasks assessed     Mobility Bed Mobility Bed Mobility: Not assessed Transfers Transfers: Sit to Stand;Stand to Sit Sit to Stand: 4: Min guard;With upper extremity assist;From chair/3-in-1 Stand to Sit: 4: Min guard;With upper extremity assist;To chair/3-in-1          End of Session OT - End of Session Equipment Utilized During Treatment: Gait belt (rolling walker) Activity Tolerance: Patient tolerated treatment well Patient left: in chair;with family/visitor present  GO     Earlie Raveling OTR/:L [phone] 08/15/2012, 4:17 PM

## 2012-08-15 NOTE — Evaluation (Signed)
Physical Therapy Evaluation Patient Details Name: Jonathan Reese MRN: [id number] DOB: 05-29-1985 Today's Date: 08/15/2012 Time: 8119-1478 PT Time Calculation (min): 14 min  PT Assessment / Plan / Recommendation Clinical Impression  Jonathan Reese is a 27 y/o male s/p 1 level posterior lumbar fusion. Pt mobility mostly limited by pain. Acute PT to follow pt to progress mobilty and activity tolerance.     PT Assessment  Patient needs continued PT services    Follow Up Recommendations  Home health PT;Supervision - Intermittent    Barriers to Discharge None      Equipment Recommendations  None recommended by PT    Recommendations for Other Services     Frequency Min 5X/week    Precautions / Restrictions Precautions Precautions: Back Precaution Booklet Issued: Yes (comment) Precaution Comments: Educated pt on back precautions .  Restrictions Weight Bearing Restrictions: No   Pertinent Vitals/Pain 10/10 pain in back. Pt medicated 1 hour prior to session.       Mobility  Bed Mobility Bed Mobility: Rolling Left;Left Sidelying to Sit;Sitting - Scoot to Edge of Bed Rolling Left: 3: Mod assist Left Sidelying to Sit: 4: Min assist Sitting - Scoot to Edge of Bed: 3: Mod assist Details for Bed Mobility Assistance: VCs for technique.  Manual facilitation to rotate hips /knees and assist to raise shoulders from sidelying to to sitting   Transfers Transfers: Sit to Stand;Stand to Sit;Stand Pivot Transfers Sit to Stand: 3: Mod assist;From bed;With upper extremity assist Stand to Sit: 4: Min assist;To chair/3-in-1;With upper extremity assist Stand Pivot Transfers: 4: Min assist Details for Transfer Assistance: Assist to raise from bed secondary to pain and LE weakness.   Ambulation/Gait Ambulation/Gait Assistance: Not tested (comment)    Exercises     PT Diagnosis: Difficulty walking  PT Problem List: Decreased strength;Decreased range of motion;Decreased activity tolerance;Decreased  mobility;Decreased knowledge of use of DME;Pain;Decreased knowledge of precautions PT Treatment Interventions: Gait training;Stair training;Functional mobility training;Therapeutic activities;DME instruction;Patient/family education;Neuromuscular re-education   PT Goals Acute Rehab PT Goals PT Goal Formulation: With patient Time For Goal Achievement: 08/21/12 Potential to Achieve Goals: Good Pt will Roll Supine to Left Side: with modified independence PT Goal: Rolling Supine to Left Side - Progress: Goal set today Pt will go Supine/Side to Sit: with modified independence PT Goal: Supine/Side to Sit - Progress: Goal set today Pt will go Sit to Supine/Side: with modified independence PT Goal: Sit to Supine/Side - Progress: Goal set today Pt will Transfer Bed to Chair/Chair to Bed: with modified independence PT Transfer Goal: Bed to Chair/Chair to Bed - Progress: Goal set today Pt will Ambulate: >150 feet;with modified independence;with least restrictive assistive device PT Goal: Ambulate - Progress: Goal set today  Visit Information  Last PT Received On: 08/15/12 Assistance Needed: +1    Subjective Data  Subjective: agree to PT eval  Patient Stated Goal: walk without pain   Prior Functioning  Home Living Lives With: Alone Available Help at Discharge: Family;Available 24 hours/day Type of Home: House Home Access: Level entry Home Layout: One level Home Adaptive Equipment: Walker - rolling Prior Function Level of Independence: Independent    Cognition  Cognition Overall Cognitive Status: Appears within functional limits for tasks assessed/performed Arousal/Alertness: Awake/alert Orientation Level: Oriented X4 / Intact Behavior During Session: [facility] for tasks performed    Extremity/Trunk Assessment Right Lower Extremity Assessment RLE ROM/Strength/Tone: Unable to fully assess;Due to pain RLE Sensation: WFL - Proprioception;WFL - Light Touch RLE Coordination: WFL - gross  motor Left  Lower Extremity Assessment LLE ROM/Strength/Tone: Unable to fully assess;Due to pain LLE Sensation: WFL - Light Touch;WFL - Proprioception LLE Coordination: WFL - gross motor Trunk Assessment Trunk Assessment: Normal   Balance Balance Balance Assessed: Yes Static Sitting Balance Static Sitting - Balance Support: Feet supported;Bilateral upper extremity supported Static Sitting - Level of Assistance: 5: Stand by assistance  End of Session PT - End of Session Equipment Utilized During Treatment: Gait belt Activity Tolerance: Patient limited by pain Patient left: in chair;with call bell/phone within reach;with family/visitor present Nurse Communication: Mobility status  GP     Leopold Smyers 08/15/2012, 3:53 PM Makyla Bye L. Daziah Hesler DPT [phone]

## 2012-08-15 NOTE — Progress Notes (Signed)
Pt voiding but he still wants to wait until tomorrow he says he wants his flow to be a little better and he thinks Flomax will help he has had this problem before .
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
## 2012-08-15 NOTE — Progress Notes (Signed)
UR COMPLETED  

## 2012-08-15 NOTE — Progress Notes (Signed)
Physical Therapy Treatment Patient Details Name: Jonathan Reese MRN: [id number] DOB: 1985/05/25 Today's Date: 08/15/2012 Time: 4696-2952 PT Time Calculation (min): 35 min  PT Assessment / Plan / Recommendation Comments on Treatment Session  Pt mobility continues to be hindered by pain.  Pt would benefit from continued PT in acute setting in preparation for d/c to home tomorrow. Pt willl be staying with his parents.      Follow Up Recommendations  Home health PT;Supervision - Intermittent     Does the patient have the potential to tolerate intense rehabilitation     Barriers to Discharge        Equipment Recommendations  None recommended by PT    Recommendations for Other Services    Frequency Min 5X/week   Plan Discharge plan remains appropriate;Frequency remains appropriate    Precautions / Restrictions Precautions Precautions: Back Precaution Booklet Issued: Yes (comment) Precaution Comments: Reviewed back precautions with pt.  Restrictions Weight Bearing Restrictions: No   Pertinent Vitals/Pain 8/10 pain in back.  RN notified.       Mobility  Bed Mobility Bed Mobility: Not assessed Transfers Transfers: Sit to Stand;Stand to Sit;Stand Pivot Transfers Sit to Stand: 4: Min assist;From chair/3-in-1;With upper extremity assist Stand to Sit: 4: Min assist;To chair/3-in-1;With upper extremity assist Stand Pivot Transfers: 4: Min assist Details for Transfer Assistance: Assist to raise from chair. Pt unable to scoot hips forward or back in chair without assistance.  Demonstrating LE weakness in standing.   Ambulation/Gait Ambulation/Gait Assistance: 4: Min guard;5: Supervision Ambulation Distance (Feet): 200 Feet Assistive device: Rolling walker Ambulation/Gait Assistance Details: Pt initiallyu required min guard assist secondary to LE weakness. VCs for decreased dependence on UEs.  Pt  Gait Pattern: Decreased stride length Stairs: No    Exercises     PT Diagnosis:     PT Problem List:   PT Treatment Interventions:     PT Goals Acute Rehab PT Goals PT Goal Formulation: With patient Time For Goal Achievement: 08/21/12 Potential to Achieve Goals: Good Pt will Roll Supine to Left Side: with modified independence Pt will Transfer Bed to Chair/Chair to Bed: with modified independence PT Transfer Goal: Bed to Chair/Chair to Bed - Progress: Progressing toward goal Pt will Ambulate: >150 feet;with modified independence;with least restrictive assistive device PT Goal: Ambulate - Progress: Progressing toward goal  Visit Information  Last PT Received On: 08/15/12    Subjective Data  Subjective: I am sore from sitting in this chair.    Cognition  Cognition Overall Cognitive Status: Appears within functional limits for tasks assessed/performed Arousal/Alertness: Awake/alert Orientation Level: Oriented X4 / Intact Behavior During Session: WFL for tasks performed    Balance  Balance Balance Assessed: Yes Static Standing Balance Static Standing - Balance Support: Bilateral upper extremity supported Static Standing - Level of Assistance: 4: Min assist Static Standing - Comment/# of Minutes: Pt with increased a-p sway and need for UE in standing.  End of Session PT - End of Session Equipment Utilized During Treatment: Gait belt Activity Tolerance: Patient limited by pain Patient left: in chair;with call bell/phone within reach;with family/visitor present Nurse Communication: Mobility status   GP     [name] 08/15/2012, 4:07 PM [name] L. Makala Fetterolf DPT [phone]

## 2012-08-15 NOTE — Progress Notes (Signed)
Pt  Unable to void and I/O cath done by nursing tech as ordered with 1500 urine returned / bladder scan was done first

## 2012-08-16 NOTE — Progress Notes (Signed)
Physical Therapy Treatment Patient Details Name: Jonathan Reese MRN: [id number] DOB: 1986-02-06 Today's Date: 08/16/2012 Time: 0950-1005 PT Time Calculation (min): 15 min  PT Assessment / Plan / Recommendation Comments on Treatment Session  Patient moving well this AM. Stated prefers to sleep in recliner due to pain. Eager to DC home    Follow Up Recommendations  Home health PT;Supervision - Intermittent     Does the patient have the potential to tolerate intense rehabilitation     Barriers to Discharge        Equipment Recommendations  None recommended by PT    Recommendations for Other Services    Frequency Min 5X/week   Plan Discharge plan remains appropriate;Frequency remains appropriate    Precautions / Restrictions Precautions Precautions: Back Precaution Comments: Patient able to recall all precautions   Pertinent Vitals/Pain     Mobility  Bed Mobility Bed Mobility: Not assessed Transfers Sit to Stand: 6: Modified independent (Device/Increase time) Stand Pivot Transfers: 6: Modified independent (Device/Increase time) Details for Transfer Assistance: increased time Ambulation/Gait Ambulation/Gait Assistance: 5: Supervision Ambulation Distance (Feet): 250 Feet Assistive device: Rolling walker Ambulation/Gait Assistance Details: Patient with less dependence on RW this session    Exercises     PT Diagnosis:    PT Problem List:   PT Treatment Interventions:     PT Goals Acute Rehab PT Goals PT Transfer Goal: Bed to Chair/Chair to Bed - Progress: Progressing toward goal PT Goal: Ambulate - Progress: Progressing toward goal  Visit Information  Last PT Received On: 08/16/12 Assistance Needed: +1    Subjective Data      Cognition  Cognition Overall Cognitive Status: Appears within functional limits for tasks assessed/performed Arousal/Alertness: Awake/alert Orientation Level: Oriented X4 / Intact Behavior During Session: [facility] for tasks performed     Balance     End of Session PT - End of Session Equipment Utilized During Treatment: Gait belt Activity Tolerance: Patient tolerated treatment well Patient left: in chair;with call bell/phone within reach;with family/visitor present Nurse Communication: Mobility status   GP     Fredrich Birks 08/16/2012, 10:08 AM  08/16/2012 Fredrich Birks PTA [phone] pager [phone] office

## 2012-08-16 NOTE — Progress Notes (Signed)
2 Day PO L4-5 TLIF for DDD/LBP. Doing well, pain and stiffness in LB as expected, otherwise without complaint. Pain well controlled on medications.  Pt reports that voiding is very acceptable.  BP 110/58  Pulse 110  Temp(Src) 98.5 F (36.9 C) (Oral)  Resp 18  Ht 5' 6.14" (1.68 m)  Wt 76.1 kg (167 lb 12.3 oz)  BMI 26.96 kg/m2  SpO2 97% Dressing C/D/I, 2+ DPP, - Homans, 5/5 DFLX/PFLX =BIL, neurovascularly intact  2 Day PO L4-5 TLIF for DDD/LBP  On oral pain meds   Written scripts in chart for D/C   D/C today.  F/u as arranged.
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
## 2014-01-27 ENCOUNTER — Ambulatory Visit (INDEPENDENT_AMBULATORY_CARE_PROVIDER_SITE_OTHER): Payer: 59 | Admitting: Internal Medicine

## 2014-01-27 ENCOUNTER — Encounter: Payer: Self-pay | Admitting: Internal Medicine

## 2014-01-27 VITALS — BP 110/74 | HR 81 | Temp 97.8°F | Wt 172.5 lb

## 2014-01-27 DIAGNOSIS — L989 Disorder of the skin and subcutaneous tissue, unspecified: Secondary | ICD-10-CM

## 2014-01-27 MED ORDER — KETOCONAZOLE 2 % EX CREA: 1.0000 "application " | TOPICAL_CREAM | Freq: Two times a day (BID) | CUTANEOUS | Status: AC

## 2014-01-27 NOTE — Progress Notes (Signed)
Pre visit review using our clinic review tool, if applicable. No additional management support is needed unless otherwise documented below in the visit note. 
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
## 2014-01-27 NOTE — Patient Instructions (Signed)
Apply the cream twice a day x 3 weeks, if no improvement, let us know

## 2014-06-07 IMAGING — CR DG CHEST 2V
2 series · 2 of 2 positions shown · non-contrast
Comparison: 07/27/2007.

CLINICAL DATA: 26-year-old male preoperative study for spine
surgery.

CHEST - 2 VIEW

[view not recorded (1 of 2)]
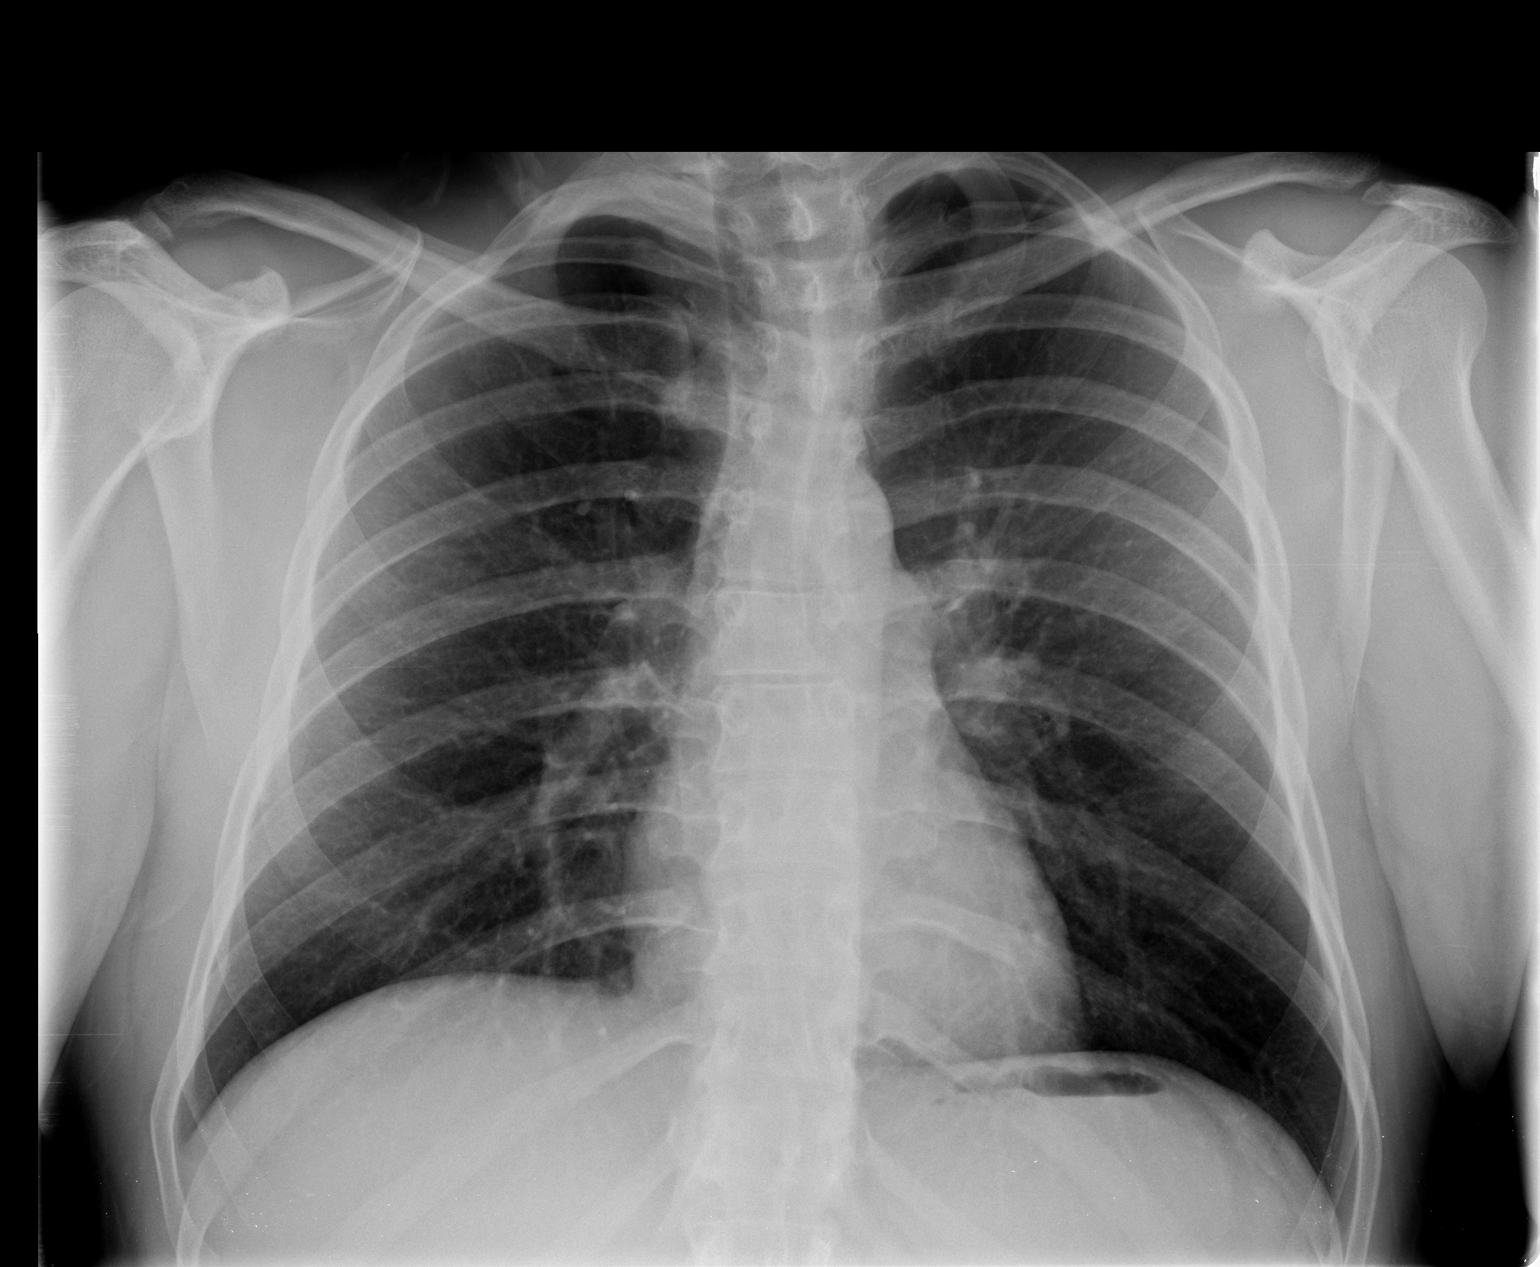

[view not recorded (2 of 2)]
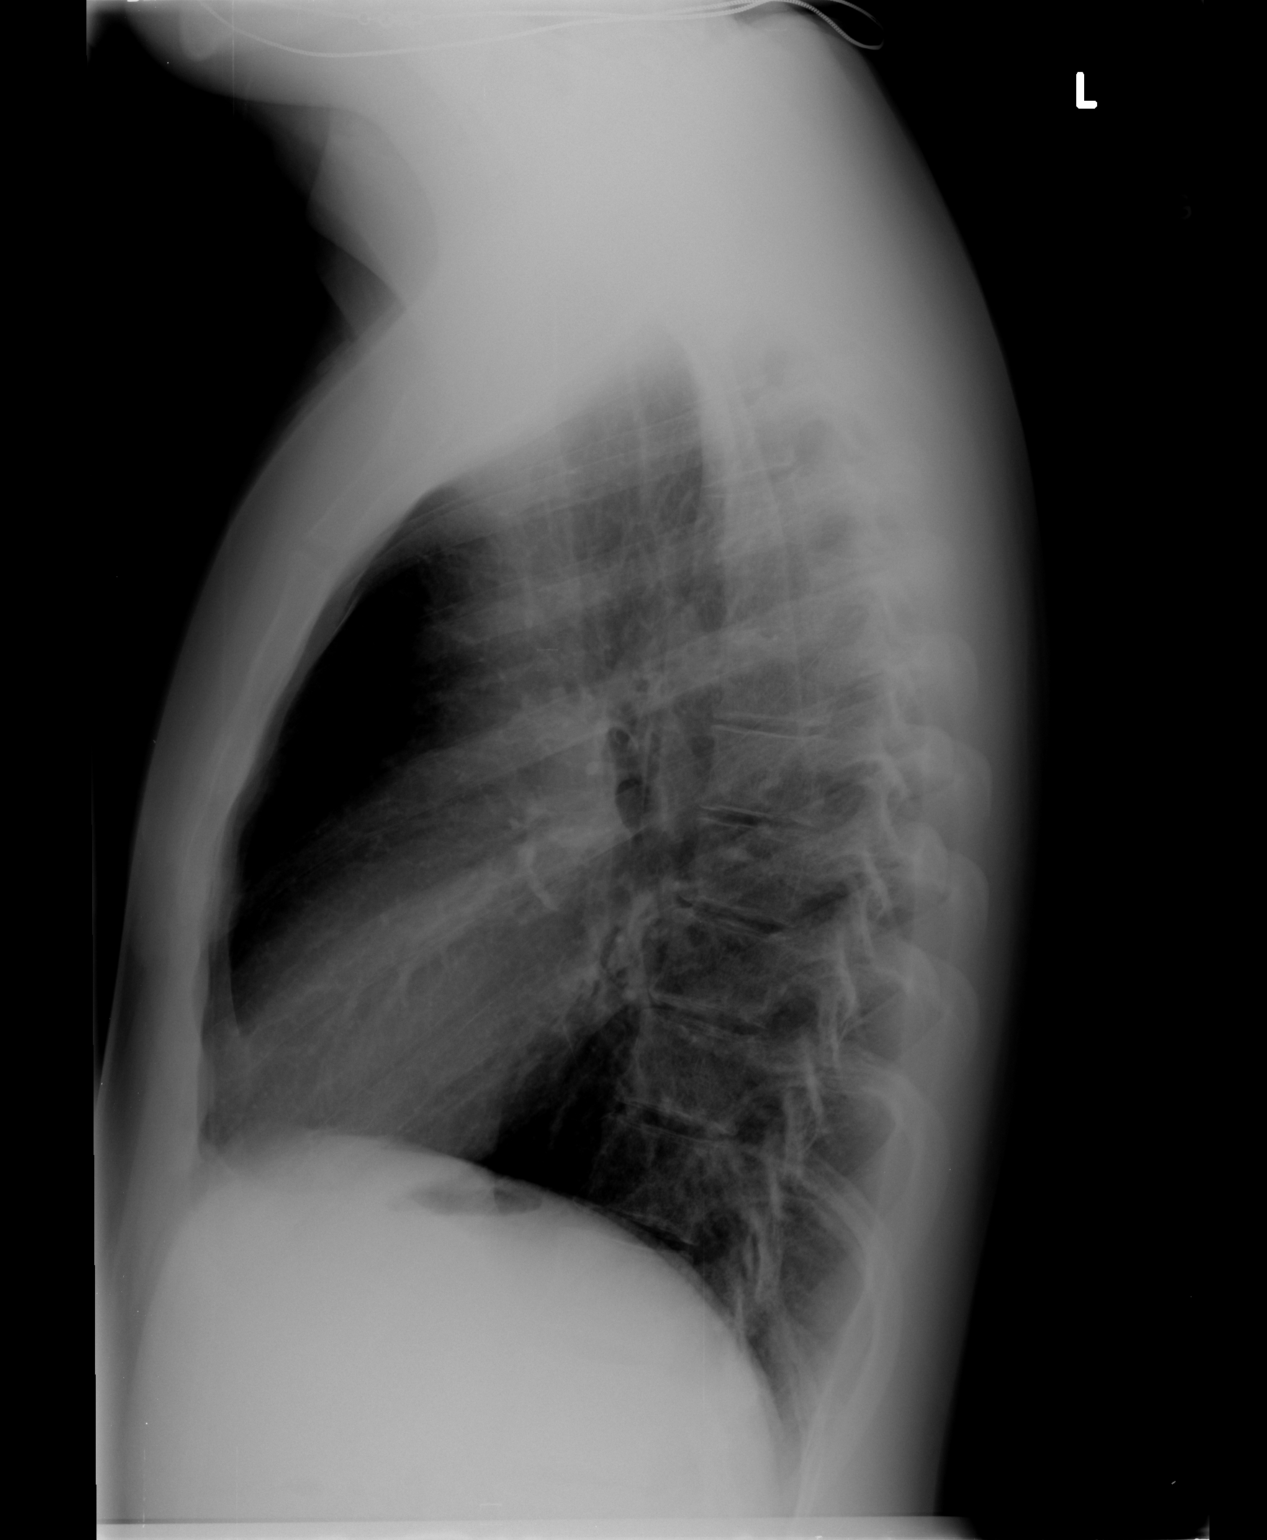

[2 of 2 positions shown; findings below may reference images not displayed]

FINDINGS: Mild scoliosis appears stable. Normal cardiac size and
mediastinal contours.  Lungs are clear.  No pneumothorax or
effusion. No acute osseous abnormality identified.
IMPRESSION: No acute cardiopulmonary abnormality.

## 2014-06-14 IMAGING — RF DG C-ARM 61-120 MIN
1 series · 2 of 2 positions shown · non-contrast
Comparison: MRI 04/25/2012.

CLINICAL DATA: Back pain

LUMBAR SPINE - 2-3 VIEW,DG C-ARM 61-120 MIN

[Series 1: run · 2 of 2 slices shown]
[im 1/2]
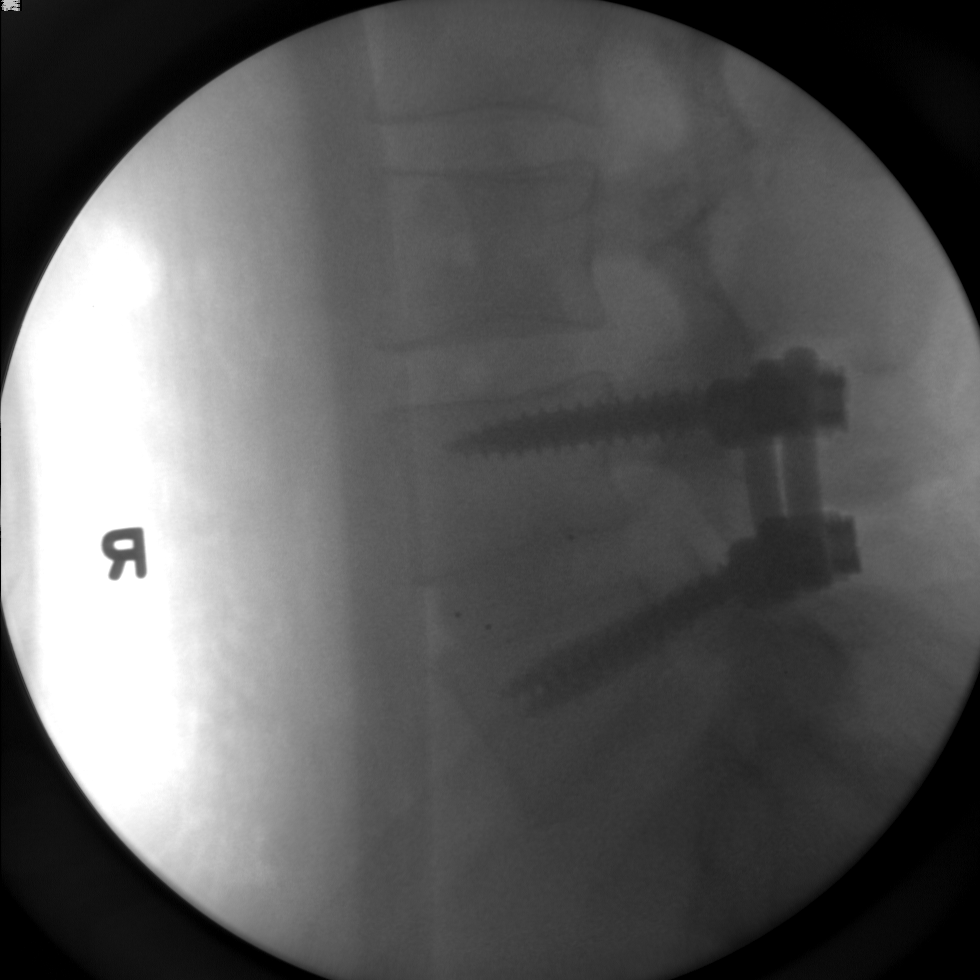
[im 2/2]
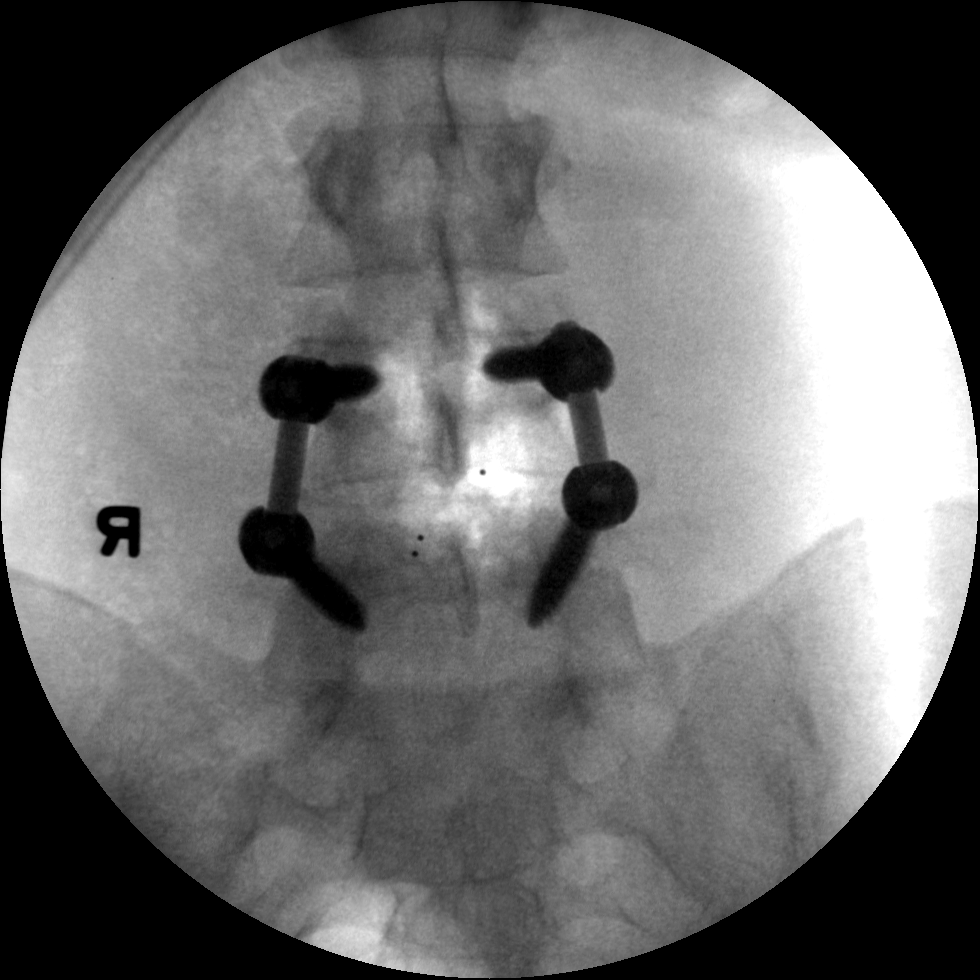

[2 of 2 positions shown; findings below may reference images not displayed]

FINDINGS: C-arm films document changes of T L I F at L4-5.
IMPRESSION: As above.

## 2023-05-25 ENCOUNTER — Emergency Department (HOSPITAL_COMMUNITY)
Admission: EM | Admit: 2023-05-25 | Discharge: 2023-05-25 | Disposition: A | Discharge status: Home / Self Care | Payer: 59 | Attending: Emergency Medicine | Admitting: Emergency Medicine

## 2023-05-25 ENCOUNTER — Other Ambulatory Visit: Payer: Self-pay

## 2023-05-25 ENCOUNTER — Emergency Department (HOSPITAL_COMMUNITY): Payer: 59

## 2023-05-25 ENCOUNTER — Encounter (HOSPITAL_COMMUNITY): Payer: Self-pay | Admitting: *Deleted

## 2023-05-25 DIAGNOSIS — S42031A Displaced fracture of lateral end of right clavicle, initial encounter for closed fracture: Secondary | ICD-10-CM | POA: Diagnosis not present

## 2023-05-25 DIAGNOSIS — Y92410 Unspecified street and highway as the place of occurrence of the external cause: Secondary | ICD-10-CM | POA: Insufficient documentation

## 2023-05-25 DIAGNOSIS — S4991XA Unspecified injury of right shoulder and upper arm, initial encounter: Secondary | ICD-10-CM | POA: Diagnosis present

## 2023-05-25 MED ORDER — OXYCODONE-ACETAMINOPHEN 5-325 MG PO TABS
1.0000 | ORAL_TABLET | Freq: Once | ORAL | Status: AC
  Administered 2023-05-25: 1 via ORAL
  Filled 2023-05-25: qty 1

## 2023-05-25 MED ORDER — ACETAMINOPHEN 500 MG PO TABS: 500.0000 mg | ORAL_TABLET | Freq: Four times a day (QID) | ORAL | 0 refills | Status: AC | PRN

## 2023-05-25 MED ORDER — OXYCODONE-ACETAMINOPHEN 5-325 MG PO TABS: 1.0000 | ORAL_TABLET | Freq: Three times a day (TID) | ORAL | 0 refills | Status: AC | PRN
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
## 2023-05-25 NOTE — Discharge Instructions (Addendum)
 You are seen in the emergency room for shoulder injury. X-rays confirm that you have collarbone fracture.  Please call the orthopedic doctor at the number provided to set up an appointment in 7 to 10 days.  Keep the arm in the sling at all times.  Ice the clavicle aggressively for the next 2 days.  Take Tylenol  every 6 hours.  Take the stronger pain medicine only if needed in between.
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
